# Patient Record
Sex: Female | Born: 2000 | Race: Black or African American | Hispanic: No | Marital: Single | Smoking: Never smoker
Health system: Southern US, Community
[De-identification: ages and names within clinical notes are randomized; demographics above are authoritative.]

## PROBLEM LIST (undated history)

## (undated) DIAGNOSIS — J45909 Unspecified asthma, uncomplicated: Secondary | ICD-10-CM

## (undated) DIAGNOSIS — A749 Chlamydial infection, unspecified: Secondary | ICD-10-CM

## (undated) HISTORY — DX: Chlamydial infection, unspecified: A74.9

---
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
## 2021-01-10 ENCOUNTER — Other Ambulatory Visit: Payer: Self-pay

## 2021-01-10 ENCOUNTER — Ambulatory Visit: Payer: Medicaid Other | Attending: Family Medicine

## 2021-01-10 DIAGNOSIS — M25562 Pain in left knee: Secondary | ICD-10-CM | POA: Diagnosis not present

## 2021-01-10 DIAGNOSIS — M6281 Muscle weakness (generalized): Secondary | ICD-10-CM | POA: Diagnosis not present

## 2021-01-10 DIAGNOSIS — M25561 Pain in right knee: Secondary | ICD-10-CM | POA: Diagnosis not present

## 2021-01-10 DIAGNOSIS — G8929 Other chronic pain: Secondary | ICD-10-CM | POA: Diagnosis not present

## 2021-01-10 DIAGNOSIS — R262 Difficulty in walking, not elsewhere classified: Secondary | ICD-10-CM | POA: Insufficient documentation

## 2021-01-11 NOTE — Therapy (Signed)
[facility] [address] [place], [place], [address] Phone: [phone]   Fax:  [phone]  Physical Therapy Evaluation  Patient Details  Name: Tammy Jackson MRN: [id number] Date of Birth: [birth year] Referring Provider (Tammy Jackson): McDiarmid, Leighton Roach, MD   Encounter Date: 01/10/2021   Tammy Jackson End of Session - 01/11/21 0807    Visit Number 1    Number of Visits 13    Date for Tammy Jackson Re-Evaluation 03/03/21    Authorization Type [place] MEDICAID HEALTHY BLUE    Progress Note Due on Visit 10    Tammy Jackson Start Time 1100    Tammy Jackson Stop Time 1140    Tammy Jackson Time Calculation (min) 40 min    Activity Tolerance Patient tolerated treatment well    Behavior During Therapy [facility] for tasks assessed/performed           Past Medical History:  Diagnosis Date  . Asthma     History reviewed. No pertinent surgical history.  There were no vitals filed for this visit.    Subjective Assessment - 01/10/21 1150    Subjective Both knees having been hurting for a couple of years, R>L. Takes naproxen sometimes before or after a walk. Tries to walk every other day, pain does not occur with each walk. Pain occurs during and after walking. Walks for 15 to 20 mins.    Patient Stated Goals For my knee to be fixed    Currently in Pain? Yes    Pain Score 5     Pain Location Knee    Pain Orientation Right    Pain Descriptors / Indicators Aching    Pain Type Chronic pain    Pain Radiating Towards NA    Pain Onset More than a month ago    Pain Frequency Intermittent    Aggravating Factors  Walking, squating    Pain Relieving Factors Naproxen sometimes, rest and elevating legs    Multiple Pain Sites Yes    Pain Score 0    Pain Location Knee    Pain Orientation Left              [facility] Tammy Jackson Assessment - 01/11/21 0001      Assessment   Medical Diagnosis Chronic pain of both knees    Referring Provider (Tammy Jackson) McDiarmid, Leighton Roach, MD    Onset Date/Surgical Date --   a couple  of years   Hand Dominance Right    Prior Therapy No      Precautions   Precautions None      Restrictions   Weight Bearing Restrictions No      Balance Screen   Has the patient fallen in the past 6 months No      Home Environment   Living Environment Private residence    Living Arrangements Parent    Type of Home Apartment    Home Access Stairs to enter    Entrance Stairs-Number of Steps 14    Entrance Stairs-Rails Right;Left    Home Layout One level      Prior Function   Level of Independence Independent    Vocation Unemployed    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      Cognition   Overall Cognitive Status Within Functional Limits for tasks assessed      Observation/Other Assessments   Focus on Therapeutic Outcomes (FOTO)  NA      Sensation   Light Touch Appears Intact  Posture/Postural Control   Posture Comments Valgus knee alignment      ROM / Strength   AROM / PROM / Strength AROM;Strength      AROM   Overall AROM Comments Hip, knee, ankle AROM is WNLs      Strength   Strength Assessment Site Hip;Knee    Right/Left Hip Right;Left    Right Hip Flexion 4/5    Right Hip Extension 4/5    Right Hip External Rotation  4+/5    Right Hip Internal Rotation 4+/5    Right Hip ABduction 4+/5    Right Hip ADduction 4+/5    Left Hip Flexion 4+/5    Left Hip Extension 4+/5    Left Hip External Rotation 4+/5    Left Hip Internal Rotation 4+/5    Left Hip ABduction 4+/5    Left Hip ADduction 4+/5    Right/Left Knee Right;Left    Right Knee Flexion 5/5   Tammy Jackson reports her muscle feels weaker, shaky   Right Knee Extension 5/5   Tammy Jackson reports her muscle feels weaker, shaky   Left Knee Flexion 5/5    Left Knee Extension 5/5      Palpation   Patella mobility WNLs    Palpation comment TTP to the R medial distal patella      Special Tests    Special Tests Knee Special Tests;Laxity/Instability Tests;Meniscus Tests    Laxity/Instability   Anterior drawer test;Posterior drawer test    Knee Special tests  Patellofemoral Apprehension Test;Patellofemoral Grind Test (Clarke's Sign)    Meniscus Tests McMurray Test      Anterior drawer test   Findings Negative    Side Right   Lt     Posterior drawer test   Findings Negative    Side  Right   Lt     Patellofemoral Apprehension Test    Findings Negative    Side  Right   Lt     Patellofemoral Grind test (Clark's Sign)   Findings Negative    Side  Right   Lt     McMurray Test   Findings Negative    Side Right   Lt     Transfers   Transfers Sit to Stand;Stand to Sit    Sit to Stand 7: Independent      Ambulation/Gait   Ambulation/Gait Yes    Ambulation/Gait Assistance 7: Independent    Gait Pattern Step-through pattern   lateral heel strike to medial toe off   Gait velocity WNLs                      Objective measurements completed on examination: See above findings.               Tammy Jackson Education - 01/11/21 0807    Education Details Eval findings, POC, HEP    Person(s) Educated Patient    Methods Explanation;Demonstration;Tactile cues;Verbal cues;Handout    Comprehension Verbalized understanding;Returned demonstration;Verbal cues required;Tactile cues required            Tammy Jackson Short Term Goals - 01/11/21 0949      Tammy Jackson SHORT TERM GOAL #1   Title Tammy Jackson will be Ind in an initial HEP    Baseline Started on eval    Status New    Target Date 02/01/21      Tammy Jackson SHORT TERM GOAL #2   Title Tammy Jackson will voice understanding of measures and techniques to assist in pain reduction and management  Status New    Target Date 02/01/21             Tammy Jackson Long Term Goals - 01/11/21 0950      Tammy Jackson LONG TERM GOAL #1   Title Tammy Jackson will be Ind in a final HEP to maintain or progress achieved LOF    Status New    Target Date 03/03/21      Tammy Jackson LONG TERM GOAL #2   Title Tammy Jackson will be able to walk 20 to 30 mins and asc/dsc steps with decreased intermittent knee pain of  2/10 or less    Baseline 5/10    Status New    Target Date 03/03/21      Tammy Jackson LONG TERM GOAL #3   Title Tammy Jackson will be able to complete 5 partial SL sit to stands from an appropriate height with the R demonstrating equal control/stability to the L    Status New    Target Date 03/03/21      Tammy Jackson LONG TERM GOAL #4   Title Tammy Jackson's R hip flexors and hip extensors will demostrate 4+ to 5/5 strength and Tammy Jackson will report the feeling the R quad and hamstring strength being equal to the LP    Baseline R hip flexors and extensors 4/5, Tammy Jackson reports her R quads and hamstrings feel weaker than the L although they test at 5/5    Status New    Target Date 03/03/21                  Plan - 01/11/21 0809    Clinical Impression Statement Tammy Jackson presents c a chronic Hx of intermittent knee pain R> L. Currently, Tammy Jackson is not experiencing L knee pain. 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 R hip flexion and extension are decreased in comparison to L. While testing at 5/5, Tammy Jackson reports the R quads and hamstrings feel weaker than the L. Tammy Jackson will benefit for Tammy Jackson 2w6 to address R LE strength and flexibility to decrease pain and optimize function.    Personal Factors and Comorbidities Time since onset of injury/illness/exacerbation;Comorbidity 1    Comorbidities High BMI    Examination-Activity Limitations Locomotion Level;Stairs;Squat;Bend    Stability/Clinical Decision Making Stable/Uncomplicated    Clinical Decision Making Low    Rehab Potential Good    Tammy Jackson Frequency 2x / week    Tammy Jackson Duration 6 weeks    Tammy Jackson Treatment/Interventions ADLs/Self Care Home Management;Cryotherapy;Electrical Stimulation;Ultrasound;Moist Heat;Iontophoresis 4mg /ml Dexamethasone;Gait training;Stair training;Functional mobility training;Therapeutic activities;Therapeutic exercise;Balance training;Manual techniques;Patient/family education;Passive range of motion;Dry needling;Taping;Joint Manipulations    Tammy Jackson Next Visit Plan Assess R hip flexors, quad, and  hamstring flexibility. Assess response to HEP. Progress ther ex as inducated. Use modalities and manual techniques as indicated.    Tammy Jackson Home Exercise Plan           Patient will benefit from skilled therapeutic intervention in order to improve the following deficits and impairments:  Difficulty walking,Obesity,Decreased activity tolerance,Pain,Decreased strength  Visit Diagnosis: Chronic pain of both knees  Difficulty in walking, not elsewhere classified  Muscle weakness (generalized)     Problem List Patient Active Problem List   Diagnosis Date Noted  . Nausea 09/29/2020  . Encounter for initial prescription of contraceptive pills 09/29/2020  . Viral illness 06/06/2020  . Bilateral knee pain 05/12/2020  . Encounter for medical examination to establish care 05/12/2020  . Abnormal uterine bleeding (AUB) 07/25/2019  . Symptomatic anemia 07/25/2019    09/24/2019 Tammy Jackson, Tammy Jackson 01/11/21 9:59 AM  [facility] [address] [place], [place], [address] Phone: [phone]   Fax:  [phone]  Name: Tammy Jackson MRN: [id number] Date of Birth: 11/05/2000   Check all possible CPT codes: 97110- Therapeutic Exercise, [phone] - Gait Training, [phone] - Manual Therapy, 97530 - Therapeutic Activities, [id number] - Self Care, [phone] - Electrical stimulation (Manual) and [id number] - Ultrasound

## 2021-01-16 ENCOUNTER — Other Ambulatory Visit: Payer: Self-pay

## 2021-01-16 ENCOUNTER — Encounter: Payer: Self-pay | Admitting: Physical Therapy

## 2021-01-16 ENCOUNTER — Ambulatory Visit: Payer: Medicaid Other | Attending: Family Medicine | Admitting: Physical Therapy

## 2021-01-16 DIAGNOSIS — R262 Difficulty in walking, not elsewhere classified: Secondary | ICD-10-CM | POA: Insufficient documentation

## 2021-01-16 DIAGNOSIS — M25562 Pain in left knee: Secondary | ICD-10-CM | POA: Diagnosis not present

## 2021-01-16 DIAGNOSIS — G8929 Other chronic pain: Secondary | ICD-10-CM | POA: Insufficient documentation

## 2021-01-16 DIAGNOSIS — M25561 Pain in right knee: Secondary | ICD-10-CM | POA: Diagnosis not present

## 2021-01-16 DIAGNOSIS — M6281 Muscle weakness (generalized): Secondary | ICD-10-CM | POA: Insufficient documentation

## 2021-01-16 NOTE — Therapy (Signed)
[facility] [address] [place], [place], [address] Phone: [phone]   Fax:  [phone]  Physical Therapy Treatment  Patient Details  Name: Tammy Jackson MRN: [id number] Date of Birth: 08/17/2001 Referring Provider (PT): McDiarmid, Leighton Roach, MD   Encounter Date: 01/16/2021   PT End of Session - 01/16/21 1231    Visit Number 2    Number of Visits 13    Date for PT Re-Evaluation 03/03/21    Authorization Type [place] MEDICAID HEALTHY BLUE    PT Start Time 1145    PT Stop Time 1228    PT Time Calculation (min) 43 min    Activity Tolerance Patient tolerated treatment well    Behavior During Therapy [facility] for tasks assessed/performed           Past Medical History:  Diagnosis Date  . Asthma     History reviewed. No pertinent surgical history.  There were no vitals filed for this visit.   Subjective Assessment - 01/16/21 1147    Subjective No new complaints/concerns since initial eval. No pain pre-tx.    Currently in Pain? No/denies                             [facility] Adult PT Treatment/Exercise - 01/16/21 0001      Exercises   Exercises Knee/Hip      Knee/Hip Exercises: Aerobic   Recumbent Bike L1 x 5 minutes      Knee/Hip Exercises: Standing   Hip Flexion AROM;Stengthening;Right;Left;10 reps;Knee straight    Hip Flexion Limitations (standing SLR with green band)    Terminal Knee Extension AROM;Right;Strengthening;Left;1 set;15 reps    Theraband Level (Terminal Knee Extension) Level 3 (Green)    Hip Abduction AROM;Stengthening;Right;Left;10 reps    Abduction Limitations green band    Hip Extension AROM;Stengthening;Right;Left;10 reps    Extension Limitations green band    Functional Squat Limitations partial squat at counter x 15 reps    Other Standing Knee Exercises sidestepping at counter with green band around ankles back and forth x 2 (approximately 10 feet)      Knee/Hip Exercises:  Seated   Long Arc Quad AROM;Strengthening;Right;Left;2 sets;10 reps    Long Arc Quad Weight 4 lbs.    Other Seated Knee/Hip Exercises seated hip ER with green Theraband 2x10 ea. bilat.      Knee/Hip Exercises: Supine   Bridges AROM;Strengthening;Both;2 sets;10 reps    Straight Leg Raises AROM;Strengthening;Right;Left;2 sets    Other Supine Knee/Hip Exercises clamshell green band 2x10      Manual Therapy   Manual Therapy Joint mobilization    Joint Mobilization patellar mobilization bilat. grade III medial glides and tilt                  PT Education - 01/16/21 1230    Education Details HEP, POC    Person(s) Educated Patient    Methods Explanation;Demonstration;Verbal cues;Handout    Comprehension Verbalized understanding;Returned demonstration            PT Short Term Goals - 01/11/21 0949      PT SHORT TERM GOAL #1   Title Pt will be Ind in an initial HEP    Baseline Started on eval    Status New    Target Date 02/01/21      PT SHORT TERM GOAL #2   Title Pt will voice understanding of measures and techniques to assist in pain reduction  and management    Status New    Target Date 02/01/21             PT Long Term Goals - 01/11/21 0950      PT LONG TERM GOAL #1   Title Pt will be Ind in a final HEP to maintain or progress achieved LOF    Status New    Target Date 03/03/21      PT LONG TERM GOAL #2   Title Pt will be able to walk 20 to 30 mins and asc/dsc steps with decreased intermittent knee pain of 2/10 or less    Baseline 5/10    Status New    Target Date 03/03/21      PT LONG TERM GOAL #3   Title Pt will be able to complete 5 partial SL sit to stands from an appropriate height with the R demonstrating equal control/stability to the L    Status New    Target Date 03/03/21      PT LONG TERM GOAL #4   Title Pt's R hip flexors and hip extensors will demostrate 4+ to 5/5 strength and pt will report the feeling the R quad and hamstring strength  being equal to the LP    Baseline R hip flexors and extensors 4/5, pt reports her R quads and hamstrings feel weaker than the L although they test at 5/5    Status New    Target Date 03/03/21                 Plan - 01/16/21 1231    Clinical Impression Statement Pt. returns for first follow up visit after initial evaluation-worked on open and closed chain knee and hip strengthening as noted per flowsheet with good tolerance/no significant pain reported and also added patellar mobilizations for medial glide and tilt also with good tolerance. Updated HEP with hip abduction SLR and bridge for further strengthening. Symptoms and presentation consistent with PFPS and expect gradual progress with continued strengthening with PT.    Personal Factors and Comorbidities Time since onset of injury/illness/exacerbation;Comorbidity 1    Comorbidities High BMI    Examination-Activity Limitations Locomotion Level;Stairs;Squat;Bend    Stability/Clinical Decision Making Stable/Uncomplicated    Clinical Decision Making Low    Rehab Potential Good    PT Frequency 2x / week    PT Duration 6 weeks    PT Treatment/Interventions ADLs/Self Care Home Management;Cryotherapy;Electrical Stimulation;Ultrasound;Moist Heat;Iontophoresis 4mg /ml Dexamethasone;Gait training;Stair training;Functional mobility training;Therapeutic activities;Therapeutic exercise;Balance training;Manual techniques;Patient/family education;Passive range of motion;Dry needling;Taping;Joint Manipulations    PT Next Visit Plan Continue/progress hip and knee strengthening as tolerated, pending progess and response also consider trial patellar taping if needed    PT Home Exercise Plan    Consulted and Agree with Plan of Care Patient           Patient will benefit from skilled therapeutic intervention in order to improve the following deficits and impairments:  Difficulty walking,Obesity,Decreased activity tolerance,Pain,Decreased  strength  Visit Diagnosis: Chronic pain of both knees  Difficulty in walking, not elsewhere classified  Muscle weakness (generalized)     Problem List Patient Active Problem List   Diagnosis Date Noted  . Nausea 09/29/2020  . Encounter for initial prescription of contraceptive pills 09/29/2020  . Viral illness 06/06/2020  . Bilateral knee pain 05/12/2020  . Encounter for medical examination to establish care 05/12/2020  . Abnormal uterine bleeding (AUB) 07/25/2019  . Symptomatic anemia 07/25/2019    09/24/2019, PT, DPT  01/16/21 12:35 PM  [facility] [address] [place], [place], [address] Phone: [phone]   Fax:  [phone]  Name: Tammy Jackson MRN: [id number] Date of Birth: 24-Oct-2000

## 2021-01-18 ENCOUNTER — Ambulatory Visit: Payer: Medicaid Other | Admitting: Physical Therapy

## 2021-01-23 ENCOUNTER — Other Ambulatory Visit: Payer: Self-pay

## 2021-01-23 ENCOUNTER — Ambulatory Visit: Payer: Medicaid Other | Admitting: Physical Therapy

## 2021-01-23 ENCOUNTER — Encounter: Payer: Self-pay | Admitting: Physical Therapy

## 2021-01-23 DIAGNOSIS — M25562 Pain in left knee: Secondary | ICD-10-CM

## 2021-01-23 DIAGNOSIS — M6281 Muscle weakness (generalized): Secondary | ICD-10-CM | POA: Diagnosis not present

## 2021-01-23 DIAGNOSIS — M25561 Pain in right knee: Secondary | ICD-10-CM | POA: Diagnosis not present

## 2021-01-23 DIAGNOSIS — R262 Difficulty in walking, not elsewhere classified: Secondary | ICD-10-CM | POA: Diagnosis not present

## 2021-01-23 DIAGNOSIS — G8929 Other chronic pain: Secondary | ICD-10-CM

## 2021-01-23 NOTE — Therapy (Signed)
[facility] [address] [place], [place], [address] Phone: [phone]   Fax:  [phone]  Physical Therapy Treatment  Patient Details  Name: Tammy Jackson MRN: [id number] Date of Birth: [birth year] Referring Provider (PT): McDiarmid, Leighton Roach, MD   Encounter Date: 01/23/2021   PT End of Session - 01/23/21 1159    Visit Number 3    Number of Visits 13    Date for PT Re-Evaluation 03/03/21    Authorization Type [place] MEDICAID HEALTHY BLUE    Progress Note Due on Visit 10    PT Start Time 1155   arrived late   PT Stop Time 1233    PT Time Calculation (min) 38 min    Activity Tolerance Patient tolerated treatment well    Behavior During Therapy [facility] for tasks assessed/performed           Past Medical History:  Diagnosis Date  . Asthma     History reviewed. No pertinent surgical history.  There were no vitals filed for this visit.   Subjective Assessment - 01/23/21 1157    Subjective Pt. reports still having pain associated with work activities but noting less pain than previously on days off. Just got off work so noting pain 4/10 this AM.    Currently in Pain? Yes    Pain Score 4     Pain Location Knee    Pain Orientation Right;Left    Pain Descriptors / Indicators Aching    Pain Type Chronic pain    Pain Onset More than a month ago    Pain Frequency Intermittent    Aggravating Factors  standing, lifing    Pain Relieving Factors rest, elevating legs, medication    Effect of Pain on Daily Activities impacts tolerance for work duties                             [facility] Adult PT Treatment/Exercise - 01/23/21 0001      Knee/Hip Exercises: Aerobic   Recumbent Bike L1 x 5 minutes      Knee/Hip Exercises: Standing   Terminal Knee Extension AROM;Strengthening;Left;Right;2 sets;10 reps;Theraband    Theraband Level (Terminal Knee Extension) Level 3 (Green)    Functional Squat Limitations partial  squat at counter x 20 reps    Other Standing Knee Exercises Monster walk green band proximal to knees 20 feet x 2    Other Standing Knee Exercises sidestepping with green Theraband around ankles at counter 10 feet back and forth x 2      Knee/Hip Exercises: Seated   Long Arc Quad AROM;Strengthening;Right;Left;2 sets;10 reps    Long Arc Quad Weight 5 lbs.    Other Seated Knee/Hip Exercises seated hip ER with green Theraband 2x10 ea. bilat.    Hamstring Curl AROM;Strengthening;Both;2 sets;10 reps    Hamstring Limitations green band      Knee/Hip Exercises: Supine   Bridges with Ball Squeeze AROM;Strengthening;Both;2 sets;10 reps    Straight Leg Raises AROM;Strengthening;Right;Left;2 sets;10 reps      Manual Therapy   Manual Therapy Taping    Joint Mobilization patellar mobilization bilat. grade III medial glides and tilt    McConnell taping for medial glide bilat. patella                  PT Education - 01/23/21 1250    Education Details taping-trial to see if any symptom relief-pt. instructed to leave on no more than  1-2 days    Person(s) Educated Patient    Methods Explanation;Demonstration;Verbal cues    Comprehension Verbalized understanding            PT Short Term Goals - 01/11/21 0949      PT SHORT TERM GOAL #1   Title Pt will be Ind in an initial HEP    Baseline Started on eval    Status New    Target Date 02/01/21      PT SHORT TERM GOAL #2   Title Pt will voice understanding of measures and techniques to assist in pain reduction and management    Status New    Target Date 02/01/21             PT Long Term Goals - 01/11/21 0950      PT LONG TERM GOAL #1   Title Pt will be Ind in a final HEP to maintain or progress achieved LOF    Status New    Target Date 03/03/21      PT LONG TERM GOAL #2   Title Pt will be able to walk 20 to 30 mins and asc/dsc steps with decreased intermittent knee pain of 2/10 or less    Baseline 5/10    Status New     Target Date 03/03/21      PT LONG TERM GOAL #3   Title Pt will be able to complete 5 partial SL sit to stands from an appropriate height with the R demonstrating equal control/stability to the L    Status New    Target Date 03/03/21      PT LONG TERM GOAL #4   Title Pt's R hip flexors and hip extensors will demostrate 4+ to 5/5 strength and pt will report the feeling the R quad and hamstring strength being equal to the LP    Baseline R hip flexors and extensors 4/5, pt reports her R quads and hamstrings feel weaker than the L although they test at 5/5    Status New    Target Date 03/03/21                 Plan - 01/23/21 1251    Clinical Impression Statement Though still pain with work duties mild improvement noted from previous status for improving knee pain. Continued exercise progression with good tolerance and performed trial patellar taping (end of session) for medial glide so will await further response and continue to include as found beneficial and (if beneficial) instruct on home technique as well. Plan continue PT for further progress to help relieve knee pain and improve tolerance for work duties.    Personal Factors and Comorbidities Time since onset of injury/illness/exacerbation;Comorbidity 1    Comorbidities High BMI    Examination-Activity Limitations Locomotion Level;Stairs;Squat;Bend    Stability/Clinical Decision Making Stable/Uncomplicated    Clinical Decision Making Low    Rehab Potential Good    PT Frequency 2x / week    PT Duration 6 weeks    PT Treatment/Interventions ADLs/Self Care Home Management;Cryotherapy;Electrical Stimulation;Ultrasound;Moist Heat;Iontophoresis 4mg /ml Dexamethasone;Gait training;Stair training;Functional mobility training;Therapeutic activities;Therapeutic exercise;Balance training;Manual techniques;Patient/family education;Passive range of motion;Dry needling;Taping;Joint Manipulations    PT Next Visit Plan check response to trial  patellar taping, continue to progress knee and hip strengthening as tolerated    PT Home Exercise Plan    Consulted and Agree with Plan of Care Patient           Patient will benefit from skilled therapeutic intervention in  order to improve the following deficits and impairments:  Difficulty walking,Obesity,Decreased activity tolerance,Pain,Decreased strength  Visit Diagnosis: Chronic pain of both knees  Difficulty in walking, not elsewhere classified  Muscle weakness (generalized)     Problem List Patient Active Problem List   Diagnosis Date Noted  . Nausea 09/29/2020  . Encounter for initial prescription of contraceptive pills 09/29/2020  . Viral illness 06/06/2020  . Bilateral knee pain 05/12/2020  . Encounter for medical examination to establish care 05/12/2020  . Abnormal uterine bleeding (AUB) 07/25/2019  . Symptomatic anemia 07/25/2019    Lazarus Gowda, PT, DPT 01/23/21 12:54 PM  [facility] [address] [place], [place], [address] Phone: [phone]   Fax:  [phone]  Name: Tammy Jackson MRN: [id number] Date of Birth: 2000-12-23

## 2021-01-25 ENCOUNTER — Other Ambulatory Visit: Payer: Self-pay

## 2021-01-25 ENCOUNTER — Encounter: Payer: Self-pay | Admitting: Physical Therapy

## 2021-01-25 ENCOUNTER — Ambulatory Visit: Payer: Medicaid Other | Admitting: Physical Therapy

## 2021-01-25 DIAGNOSIS — M6281 Muscle weakness (generalized): Secondary | ICD-10-CM

## 2021-01-25 DIAGNOSIS — R262 Difficulty in walking, not elsewhere classified: Secondary | ICD-10-CM | POA: Diagnosis not present

## 2021-01-25 DIAGNOSIS — M25562 Pain in left knee: Secondary | ICD-10-CM | POA: Diagnosis not present

## 2021-01-25 DIAGNOSIS — G8929 Other chronic pain: Secondary | ICD-10-CM

## 2021-01-25 DIAGNOSIS — M25561 Pain in right knee: Secondary | ICD-10-CM | POA: Diagnosis not present

## 2021-01-25 NOTE — Therapy (Signed)
[facility] [address] [place], [place], [address] Phone: [phone]   Fax:  [phone]  Physical Therapy Treatment  Patient Details  Name: Tammy Jackson MRN: [id number] Date of Birth: 2001-03-25 Referring Provider (PT): McDiarmid, Leighton Roach, MD   Encounter Date: 01/25/2021   PT End of Session - 01/25/21 1116    Visit Number 4    Number of Visits 13    Date for PT Re-Evaluation 03/03/21    Authorization Type [place] MEDICAID HEALTHY BLUE    PT Start Time 1107    PT Stop Time 1148    PT Time Calculation (min) 41 min    Activity Tolerance Patient tolerated treatment well    Behavior During Therapy [facility] for tasks assessed/performed           Past Medical History:  Diagnosis Date  . Asthma     History reviewed. No pertinent surgical history.  There were no vitals filed for this visit.   Subjective Assessment - 01/25/21 1114    Subjective Pt. reports had benefit from patellar taping applied at end of last session (medial glide). No new complaints/concerns otherwise this AM.    Currently in Pain? No/denies                             [facility] Adult PT Treatment/Exercise - 01/25/21 0001      Knee/Hip Exercises: Aerobic   Recumbent Bike L1 x 5 minutes      Knee/Hip Exercises: Standing   Heel Raises Both;2 sets;10 reps    Heel Raises Limitations on Airex holding 10 lb. DB ea. UE    Functional Squat Limitations TRX squat 2x10    Other Standing Knee Exercises SL RDL x 10 reps ea. side bilat., reverse partial lunge at counter with back foot sliding back on towel 2x10 ea. bilat.    Other Standing Knee Exercises Monster walk green band around ankles 25 feet x 2      Knee/Hip Exercises: Seated   Long Arc Quad AROM;Strengthening;Right;Left;2 sets;10 reps    Long Arc Quad Weight 5 lbs.    Other Seated Knee/Hip Exercises seated hip ER with green Theraband 2x10 ea. bilat.    Hamstring Curl  AROM;Strengthening;Both;2 sets;10 reps    Hamstring Limitations green band      Knee/Hip Exercises: Supine   Bridges with Ball Squeeze AROM;Strengthening;Both;2 sets;10 reps    Straight Leg Raises AROM;Strengthening;Right;Left;2 sets;10 reps    Other Supine Knee/Hip Exercises unilateral alternating clamshell green band x 20 reps ea. side bilaterally      Manual Therapy   McConnell taping for medial glide bilat. patella                    PT Short Term Goals - 01/11/21 0949      PT SHORT TERM GOAL #1   Title Pt will be Ind in an initial HEP    Baseline Started on eval    Status New    Target Date 02/01/21      PT SHORT TERM GOAL #2   Title Pt will voice understanding of measures and techniques to assist in pain reduction and management    Status New    Target Date 02/01/21             PT Long Term Goals - 01/11/21 0950      PT LONG TERM GOAL #1   Title Pt will be  Ind in a final HEP to maintain or progress achieved LOF    Status New    Target Date 03/03/21      PT LONG TERM GOAL #2   Title Pt will be able to walk 20 to 30 mins and asc/dsc steps with decreased intermittent knee pain of 2/10 or less    Baseline 5/10    Status New    Target Date 03/03/21      PT LONG TERM GOAL #3   Title Pt will be able to complete 5 partial SL sit to stands from an appropriate height with the R demonstrating equal control/stability to the L    Status New    Target Date 03/03/21      PT LONG TERM GOAL #4   Title Pt's R hip flexors and hip extensors will demostrate 4+ to 5/5 strength and pt will report the feeling the R quad and hamstring strength being equal to the LP    Baseline R hip flexors and extensors 4/5, pt reports her R quads and hamstrings feel weaker than the L although they test at 5/5    Status New    Target Date 03/03/21                 Plan - 01/25/21 1135    Clinical Impression Statement Good response to addition patellar taping so today applied  prior to exercises. Able to progress closed chain exercises with TRX squat and reverse lunge variation with good tolerance. So far responding well to therapy with some improvment of pain but plan continue PT for further progress to immprove tolerance for work duties.    Personal Factors and Comorbidities Time since onset of injury/illness/exacerbation;Comorbidity 1    Examination-Activity Limitations Locomotion Level;Stairs;Squat;Bend    Stability/Clinical Decision Making Stable/Uncomplicated    Clinical Decision Making Low    Rehab Potential Good    PT Frequency 2x / week    PT Duration 6 weeks    PT Treatment/Interventions ADLs/Self Care Home Management;Cryotherapy;Electrical Stimulation;Ultrasound;Moist Heat;Iontophoresis 4mg /ml Dexamethasone;Gait training;Stair training;Functional mobility training;Therapeutic activities;Therapeutic exercise;Balance training;Manual techniques;Patient/family education;Passive range of motion;Dry needling;Taping;Joint Manipulations    PT Next Visit Plan continue patellar taping and over the next 1-2 visits instruct for home taping technique, continue to progress knee and hip strengthening as tolerated with closed and open chain strengthening    PT Home Exercise Plan    Consulted and Agree with Plan of Care Patient           Patient will benefit from skilled therapeutic intervention in order to improve the following deficits and impairments:  Difficulty walking,Obesity,Decreased activity tolerance,Pain,Decreased strength  Visit Diagnosis: Chronic pain of both knees  Difficulty in walking, not elsewhere classified  Muscle weakness (generalized)     Problem List Patient Active Problem List   Diagnosis Date Noted  . Nausea 09/29/2020  . Encounter for initial prescription of contraceptive pills 09/29/2020  . Viral illness 06/06/2020  . Bilateral knee pain 05/12/2020  . Encounter for medical examination to establish care 05/12/2020  .  Abnormal uterine bleeding (AUB) 07/25/2019  . Symptomatic anemia 07/25/2019    09/24/2019, PT, DPT 01/25/21 11:50 AM  [facility] [address] [place], [place], [place] Phone: [phone]   Fax:  [phone]  Name: Tammy Jackson MRN: Sela Hilding Date of Birth: Feb 22, 2001

## 2021-01-26 ENCOUNTER — Encounter: Payer: Self-pay | Admitting: Family Medicine

## 2021-01-30 ENCOUNTER — Ambulatory Visit: Payer: Medicaid Other | Admitting: Physical Therapy

## 2021-02-01 ENCOUNTER — Other Ambulatory Visit: Payer: Self-pay

## 2021-02-01 ENCOUNTER — Ambulatory Visit: Payer: Medicaid Other

## 2021-02-01 DIAGNOSIS — G8929 Other chronic pain: Secondary | ICD-10-CM | POA: Diagnosis not present

## 2021-02-01 DIAGNOSIS — R262 Difficulty in walking, not elsewhere classified: Secondary | ICD-10-CM | POA: Diagnosis not present

## 2021-02-01 DIAGNOSIS — M25562 Pain in left knee: Secondary | ICD-10-CM | POA: Diagnosis not present

## 2021-02-01 DIAGNOSIS — M6281 Muscle weakness (generalized): Secondary | ICD-10-CM

## 2021-02-01 DIAGNOSIS — M25561 Pain in right knee: Secondary | ICD-10-CM | POA: Diagnosis not present

## 2021-02-01 NOTE — Therapy (Signed)
[facility] Outpatient Rehabilitation [facility] [address] [place], [place], [address] Phone: [phone]   Fax:  [phone]  Physical Therapy Treatment  Patient Details  Name: Tammy Jackson MRN: [id number] Date of Birth: 2001-04-09 Referring Provider (PT): McDiarmid, Leighton Roach, MD   Encounter Date: 02/01/2021   PT End of Session - 02/01/21 1119    Visit Number 5    Number of Visits 13    Date for PT Re-Evaluation 03/03/21    Authorization Type [place] MEDICAID HEALTHY BLUE    Authorization Time Period 4/5-5/14/22    Authorization - Visit Number 4    Authorization - Number of Visits 12    PT Start Time 1054   Pt was 7 mins late   PT Stop Time 1135    PT Time Calculation (min) 41 min    Activity Tolerance Patient tolerated treatment well    Behavior During Therapy [facility] for tasks assessed/performed           Past Medical History:  Diagnosis Date  . Asthma     History reviewed. No pertinent surgical history.  There were no vitals filed for this visit.   Subjective Assessment - 02/01/21 1058    Subjective Pt reports she just finished work and her knees are bothering her.    Patient Stated Goals For my knee to be fixed    Currently in Pain? Yes    Pain Score 7     Pain Location Knee    Pain Orientation Right;Left    Pain Descriptors / Indicators Aching    Pain Type Chronic pain    Pain Onset More than a month ago    Pain Frequency Intermittent    Aggravating Factors  standing and lifting    Pain Relieving Factors rest, elevating legs, medication    Effect of Pain on Daily Activities impacts tolerance for work duties                             [facility] Adult PT Treatment/Exercise - 02/01/21 0001      Exercises   Exercises Knee/Hip      Knee/Hip Exercises: Aerobic   Nustep 5 mins; L5; UEs/LEs      Knee/Hip Exercises: Seated   Long Arc Quad Right;Left;2 sets;10 reps    Long Arc Quad Weight 5 lbs.      Knee/Hip Exercises:  Supine   Bridges with Newman Pies Squeeze Strengthening;Both;2 sets;10 reps    Straight Leg Raises Right;Left;2 sets;10 reps    Other Supine Knee/Hip Exercises unilateral alternating clamshell green band 2x10 reps each side      Knee/Hip Exercises: Sidelying   Hip ABduction Right;Left;2 sets;10 reps      Manual Therapy   Manual Therapy Taping    McConnell Kinesiotaping for medial patella glide                  PT Education - 02/01/21 1251    Education Details HEP updated    Person(s) Educated Patient    Methods Explanation;Demonstration;Tactile cues;Verbal cues;Handout    Comprehension Verbalized understanding;Returned demonstration;Verbal cues required;Tactile cues required            PT Short Term Goals - 01/11/21 0949      PT SHORT TERM GOAL #1   Title Pt will be Ind in an initial HEP    Baseline Started on eval    Status New    Target Date 02/01/21  PT SHORT TERM GOAL #2   Title Pt will voice understanding of measures and techniques to assist in pain reduction and management    Status New    Target Date 02/01/21             PT Long Term Goals - 01/11/21 0950      PT LONG TERM GOAL #1   Title Pt will be Ind in a final HEP to maintain or progress achieved LOF    Status New    Target Date 03/03/21      PT LONG TERM GOAL #2   Title Pt will be able to walk 20 to 30 mins and asc/dsc steps with decreased intermittent knee pain of 2/10 or less    Baseline 5/10    Status New    Target Date 03/03/21      PT LONG TERM GOAL #3   Title Pt will be able to complete 5 partial SL sit to stands from an appropriate height with the R demonstrating equal control/stability to the L    Status New    Target Date 03/03/21      PT LONG TERM GOAL #4   Title Pt's R hip flexors and hip extensors will demostrate 4+ to 5/5 strength and pt will report the feeling the R quad and hamstring strength being equal to the LP    Baseline R hip flexors and extensors 4/5, pt reports her  R quads and hamstrings feel weaker than the L although they test at 5/5    Status New    Target Date 03/03/21                 Plan - 02/01/21 1253    Clinical Impression Statement Provided kinesiotaping with medial glides to B patellas. Provided application instructions to pt as completing. PT was continued for hip and quad strengthening today with open chain exercises. Resisted hip fallouts were added to HEP. After today's session pt reported a reduction of her bilat. knee pain from 7/10 to 4/10.    Personal Factors and Comorbidities Time since onset of injury/illness/exacerbation;Comorbidity 1    Comorbidities High BMI    Examination-Activity Limitations Locomotion Level;Stairs;Squat;Bend    Stability/Clinical Decision Making Stable/Uncomplicated    Clinical Decision Making Low    Rehab Potential Good    PT Frequency 2x / week    PT Duration 6 weeks    PT Treatment/Interventions ADLs/Self Care Home Management;Cryotherapy;Electrical Stimulation;Ultrasound;Moist Heat;Iontophoresis 4mg /ml Dexamethasone;Gait training;Stair training;Functional mobility training;Therapeutic activities;Therapeutic exercise;Balance training;Manual techniques;Patient/family education;Passive range of motion;Dry needling;Taping;Joint Manipulations    PT Next Visit Plan continue patellar taping and over the next 1-2 visits instruct for home taping technique, continue to progress knee and hip strengthening as tolerated with closed and open chain strengthening    PT Home Exercise Plan    Consulted and Agree with Plan of Care Patient           Patient will benefit from skilled therapeutic intervention in order to improve the following deficits and impairments:  Difficulty walking,Obesity,Decreased activity tolerance,Pain,Decreased strength  Visit Diagnosis: Chronic pain of both knees  Difficulty in walking, not elsewhere classified  Muscle weakness (generalized)     Problem List Patient  Active Problem List   Diagnosis Date Noted  . Nausea 09/29/2020  . Encounter for initial prescription of contraceptive pills 09/29/2020  . Viral illness 06/06/2020  . Bilateral knee pain 05/12/2020  . Encounter for medical examination to establish care 05/12/2020  . Abnormal uterine bleeding (  AUB) 07/25/2019  . Symptomatic anemia 07/25/2019    Joellyn Rued MS, PT 02/01/21 1:07 PM  [facility] [address] [place], [place], [address] Phone: [phone]   Fax:  [phone]  Name: Tammy Jackson MRN: [id number] Date of Birth: 03-23-2001

## 2021-02-04 NOTE — Progress Notes (Signed)
   Subjective:   Patient ID: Tammy Jackson    DOB: Jul 02, 2001, 20 y.o. female   MRN: [id number]  Tammy Jackson is a 20 y.o. female with a history of AUB and symptomatic anemia, bilateral knee pain here for Skin spots  HPI:  Patient presents with skin changes when she is exposed to the sunlight.  She describes it as a lacelike pattern on her legs.  She notes that it only occurs when she is exposed to the sun and it self resolves.  She also notes that it has happened when exposed to her laptop or other warm surfaces.  Denies any pain, itch, lightheadedness, dizziness.  Denies any personal or family history of autoimmune diseases.  Denies any significant past medical history or family medical history.  Denies any lower extremity swelling. Denies family history of vasculopathies.  Review of Systems:  Per HPI.   Objective:   BP 110/62   Pulse 98   Ht 5\' 4"  (1.626 m)   Wt 209 lb 6.4 oz (95 kg)   LMP 02/02/2021   SpO2 98%   BMI 35.94 kg/m  Vitals and nursing note reviewed.  General: pleasant young female, sitting comfortably in exam chair, well nourished, well developed, in no acute distress with non-toxic appearance Resp: breathing comfortably on room air, speaking in full sentences Skin: warm, dry, no current rash present MSK: Rgait normal Neuro: Alert and oriented, speech normal      Assessment & Plan:   Erythema ab igne Acute. Asymptomatic reticular rash when exposed to heat. History and patient photo appears most consistent with erythema ab igne. No current rash to evaluate. No pertinent personal or family medical history. Provided reassurance and discussed management options. Follow up PRN.  No orders of the defined types were placed in this encounter.  No orders of the defined types were placed in this encounter.    02/04/2021, DO PGY-3, [facility] 02/07/2021 3:18 PM

## 2021-02-06 ENCOUNTER — Other Ambulatory Visit: Payer: Self-pay

## 2021-02-06 ENCOUNTER — Ambulatory Visit: Payer: Medicaid Other

## 2021-02-06 DIAGNOSIS — M6281 Muscle weakness (generalized): Secondary | ICD-10-CM

## 2021-02-06 DIAGNOSIS — M25562 Pain in left knee: Secondary | ICD-10-CM | POA: Diagnosis not present

## 2021-02-06 DIAGNOSIS — G8929 Other chronic pain: Secondary | ICD-10-CM | POA: Diagnosis not present

## 2021-02-06 DIAGNOSIS — R262 Difficulty in walking, not elsewhere classified: Secondary | ICD-10-CM | POA: Diagnosis not present

## 2021-02-06 DIAGNOSIS — M25561 Pain in right knee: Secondary | ICD-10-CM | POA: Diagnosis not present

## 2021-02-07 ENCOUNTER — Encounter: Payer: Self-pay | Admitting: Family Medicine

## 2021-02-07 ENCOUNTER — Other Ambulatory Visit: Payer: Self-pay

## 2021-02-07 ENCOUNTER — Ambulatory Visit (INDEPENDENT_AMBULATORY_CARE_PROVIDER_SITE_OTHER): Payer: Medicaid Other | Admitting: Family Medicine

## 2021-02-07 VITALS — BP 110/62 | HR 98 | Ht 64.0 in | Wt 209.4 lb

## 2021-02-07 DIAGNOSIS — L590 Erythema ab igne [dermatitis ab igne]: Secondary | ICD-10-CM

## 2021-02-07 HISTORY — DX: Erythema ab igne (dermatitis ab igne): L59.0

## 2021-02-07 NOTE — Therapy (Signed)
[facility] Outpatient Rehabilitation [facility] [address] [place], [place], [address] Phone: [phone]   Fax:  [phone]  Physical Therapy Treatment  Patient Details  Name: Tammy Jackson MRN: [id number] Date of Birth: [birth year] Referring Provider (PT): McDiarmid, Leighton Roach, MD   Encounter Date: 02/06/2021   PT End of Session - 02/06/21 1556    Visit Number 6    Number of Visits 13    Date for PT Re-Evaluation 03/03/21    Authorization Type [place] MEDICAID HEALTHY BLUE    Authorization Time Period 4/5-5/14/22    Authorization - Visit Number 5    Authorization - Number of Visits 12    Progress Note Due on Visit 10    PT Start Time 1535    PT Stop Time 1615    PT Time Calculation (min) 40 min    Activity Tolerance Patient tolerated treatment well    Behavior During Therapy [facility] for tasks assessed/performed           Past Medical History:  Diagnosis Date  . Asthma     History reviewed. No pertinent surgical history.  There were no vitals filed for this visit.   Subjective Assessment - 02/06/21 1547    Subjective Pt reports her knees have been feeling better with moments of only low level pain. Today she reports having no knee pain.    Patient Stated Goals For my knee to be fixed    Currently in Pain? No/denies    Pain Score 3     Pain Location Knee    Pain Orientation Right;Left    Pain Descriptors / Indicators Aching    Pain Type Chronic pain    Pain Onset More than a month ago    Pain Frequency Intermittent                             [facility] Adult PT Treatment/Exercise - 02/07/21 0001      Exercises   Exercises Knee/Hip      Knee/Hip Exercises: Aerobic   Nustep 5 mins; L5; UEs/LEs      Knee/Hip Exercises: Seated   Long Arc Quad Right;Left;2 sets;10 reps    Long Arc Quad Weight 5 lbs.      Knee/Hip Exercises: Supine   Bridges with Newman Pies Squeeze Strengthening;Both;2 sets;10 reps    Straight Leg Raises  Right;Left;2 sets;10 reps    Straight Leg Raises Limitations 3 lbs    Other Supine Knee/Hip Exercises hip abd c green Tband 10x2; Hip add c ball sqeezes 15x, 3 sce      Knee/Hip Exercises: Sidelying   Hip ABduction Right;Left;2 sets;10 reps    Hip ABduction Limitations 3lbs                    PT Short Term Goals - 01/11/21 0949      PT SHORT TERM GOAL #1   Title Pt will be Ind in an initial HEP    Baseline Started on eval    Status New    Target Date 02/01/21      PT SHORT TERM GOAL #2   Title Pt will voice understanding of measures and techniques to assist in pain reduction and management    Status New    Target Date 02/01/21             PT Long Term Goals - 01/11/21 0950      PT LONG TERM GOAL #  1   Title Pt will be Ind in a final HEP to maintain or progress achieved LOF    Status New    Target Date 03/03/21      PT LONG TERM GOAL #2   Title Pt will be able to walk 20 to 30 mins and asc/dsc steps with decreased intermittent knee pain of 2/10 or less    Baseline 5/10    Status New    Target Date 03/03/21      PT LONG TERM GOAL #3   Title Pt will be able to complete 5 partial SL sit to stands from an appropriate height with the R demonstrating equal control/stability to the L    Status New    Target Date 03/03/21      PT LONG TERM GOAL #4   Title Pt's R hip flexors and hip extensors will demostrate 4+ to 5/5 strength and pt will report the feeling the R quad and hamstring strength being equal to the LP    Baseline R hip flexors and extensors 4/5, pt reports her R quads and hamstrings feel weaker than the L although they test at 5/5    Status New    Target Date 03/03/21                 Plan - 02/06/21 1557    Clinical Impression Statement PT was continued for bilateral knee and hip strengthening. Pt tolerated progression of reps and resistance. Pt's subjective report indicates progressive improvement with her bilateral knee pain.    Personal  Factors and Comorbidities Time since onset of injury/illness/exacerbation;Comorbidity 1    Comorbidities High BMI    Examination-Activity Limitations Locomotion Level;Stairs;Squat;Bend    Stability/Clinical Decision Making Stable/Uncomplicated    Clinical Decision Making Low    Rehab Potential Good    PT Frequency 2x / week    PT Duration 6 weeks    PT Treatment/Interventions ADLs/Self Care Home Management;Cryotherapy;Electrical Stimulation;Ultrasound;Moist Heat;Iontophoresis 4mg /ml Dexamethasone;Gait training;Stair training;Functional mobility training;Therapeutic activities;Therapeutic exercise;Balance training;Manual techniques;Patient/family education;Passive range of motion;Dry needling;Taping;Joint Manipulations    PT Next Visit Plan continue to progress knee and hip strengthening as tolerated with closed and open chain strengthening    PT Home Exercise Plan    Consulted and Agree with Plan of Care Patient           Patient will benefit from skilled therapeutic intervention in order to improve the following deficits and impairments:  Difficulty walking,Obesity,Decreased activity tolerance,Pain,Decreased strength  Visit Diagnosis: Chronic pain of both knees  Difficulty in walking, not elsewhere classified  Muscle weakness (generalized)     Problem List Patient Active Problem List   Diagnosis Date Noted  . Bilateral knee pain 05/12/2020  . Abnormal uterine bleeding (AUB) 07/25/2019  . Symptomatic anemia 07/25/2019    09/24/2019 MS, PT 02/07/21 6:58 AM  [facility] [address] [place], [place], [place] Phone: [phone]   Fax:  [phone]  Name: Tammy Jackson MRN: Sela Hilding Date of Birth: 01/07/2001

## 2021-02-07 NOTE — Patient Instructions (Signed)
   Erythema ab igne is a reticular, erythematous, pigmented dermatosis resulting from repeated exposures to moderate heat or infrared radiation  Can bee seen in relation to occupational exposure to heat sources (foundry workers, bakers), use of hot water bottles, heating pads or blankets (picture 25A-C), heated car seats, and among laptop users who hold computers on their thighs  Best way to treat this is limited exposure to heat.   Follow up if worsening symptoms.

## 2021-02-07 NOTE — Assessment & Plan Note (Signed)
Acute. Asymptomatic reticular rash when exposed to heat. History and patient photo appears most consistent with erythema ab igne. No current rash to evaluate. No pertinent personal or family medical history. Provided reassurance and discussed management options. Follow up PRN.

## 2021-02-08 ENCOUNTER — Ambulatory Visit: Payer: Medicaid Other

## 2021-02-08 DIAGNOSIS — M6281 Muscle weakness (generalized): Secondary | ICD-10-CM | POA: Diagnosis not present

## 2021-02-08 DIAGNOSIS — R262 Difficulty in walking, not elsewhere classified: Secondary | ICD-10-CM | POA: Diagnosis not present

## 2021-02-08 DIAGNOSIS — M25561 Pain in right knee: Secondary | ICD-10-CM | POA: Diagnosis not present

## 2021-02-08 DIAGNOSIS — M25562 Pain in left knee: Secondary | ICD-10-CM | POA: Diagnosis not present

## 2021-02-08 DIAGNOSIS — G8929 Other chronic pain: Secondary | ICD-10-CM | POA: Diagnosis not present

## 2021-02-08 NOTE — Therapy (Signed)
[place], [place], [address] Phone: [phone]   Fax:  [phone]  Physical Therapy Treatment  Patient Details  Name: Tammy Jackson MRN: [id number] Date of Birth: [birth year] Referring Provider (PT): McDiarmid, Blane Ohara, MD   Encounter Date: 02/08/2021   PT End of Session - 02/08/21 1106    Visit Number 7    Number of Visits 13    Date for PT Re-Evaluation 03/03/21    Authorization Type [place] MEDICAID HEALTHY BLUE    Authorization Time Period 4/5-5/14/22    Authorization - Visit Number 6    Authorization - Number of Visits 12    Progress Note Due on Visit 10    PT Start Time 1101    PT Stop Time 1131   13 min late   PT Time Calculation (min) 30 min    Activity Tolerance Patient tolerated treatment well    Behavior During Therapy [facility] for tasks assessed/performed           Past Medical History:  Diagnosis Date  . Asthma     History reviewed. No pertinent surgical history.  There were no vitals filed for this visit.   Subjective Assessment - 02/08/21 1110    Subjective Pt reports she is doing well with her knee pain. Denies knee pain today.    Patient Stated Goals For my knee to be fixed    Currently in Pain? No/denies    Pain Score 0-No pain    Pain Location Knee    Pain Orientation Right;Left                             [facility] Adult PT Treatment/Exercise - 02/08/21 0001      Exercises   Exercises Knee/Hip      Knee/Hip Exercises: Stretches   Gastroc Stretch Left;2 reps;30 seconds    Gastroc Stretch Limitations standing      Knee/Hip Exercises: Standing   Heel Raises Both;15 reps    Heel Raises Limitations toe lifts    Knee Flexion Right;Left;15 reps    Knee Flexion Limitations 3 lbs    Hip Flexion Right;Left;15 reps    Hip Flexion Limitations 3 lbs    Hip Abduction Right;Left;15 reps    Abduction Limitations 3 lbs    Hip Extension Right;Left     Extension Limitations 3 lbs    Wall Squat 10 reps;2 sets      Knee/Hip Exercises: Seated   Long Arc Quad Right;Left;1 set;15 reps    Long Arc Quad Weight 3 lbs.                  PT Education - 02/08/21 1119    Education Details Standing calf stretch    Person(s) Educated Patient    Methods Explanation;Demonstration;Tactile cues;Verbal cues;Handout    Comprehension Verbalized understanding;Returned demonstration;Verbal cues required;Tactile cues required            PT Short Term Goals - 02/08/21 1334      PT SHORT TERM GOAL #1   Title Pt will be Ind in an initial HEP    Baseline Started on eval    Status Achieved    Target Date 02/08/21      PT SHORT TERM GOAL #2   Title Pt will voice understanding of measures and techniques to assist in pain reduction and management    Status Achieved    Target Date 02/08/21  PT Long Term Goals - 01/11/21 0950      PT LONG TERM GOAL #1   Title Pt will be Ind in a final HEP to maintain or progress achieved LOF    Status New    Target Date 03/03/21      PT LONG TERM GOAL #2   Title Pt will be able to walk 20 to 30 mins and asc/dsc steps with decreased intermittent knee pain of 2/10 or less    Baseline 5/10    Status New    Target Date 03/03/21      PT LONG TERM GOAL #3   Title Pt will be able to complete 5 partial SL sit to stands from an appropriate height with the R demonstrating equal control/stability to the L    Status New    Target Date 03/03/21      PT LONG TERM GOAL #4   Title Pt's R hip flexors and hip extensors will demostrate 4+ to 5/5 strength and pt will report the feeling the R quad and hamstring strength being equal to the LP    Baseline R hip flexors and extensors 4/5, pt reports her R quads and hamstrings feel weaker than the L although they test at 5/5    Status New    Target Date 03/03/21                 Plan - 02/08/21 1126    Clinical Impression Statement Addresed L calf  cramp c gastroc stretch. Progressed LE strengthening c most of the exs completed in the CK[facility]. Pt tolerated the progression of exs in the CK[facility] with no adverse effects. STGs have been met. Pt will continue to benefit from PT to address knee ROM and strength to reduce pain and improve function of the knee/LEs.    Personal Factors and Comorbidities Time since onset of injury/illness/exacerbation;Comorbidity 1    Comorbidities High BMI    Examination-Activity Limitations Locomotion Level;Stairs;Squat;Bend    Stability/Clinical Decision Making Stable/Uncomplicated    Clinical Decision Making Low    Rehab Potential Good    PT Frequency 2x / week    PT Duration 6 weeks    PT Treatment/Interventions ADLs/Self Care Home Management;Cryotherapy;Electrical Stimulation;Ultrasound;Moist Heat;Iontophoresis 55m/ml Dexamethasone;Gait training;Stair training;Functional mobility training;Therapeutic activities;Therapeutic exercise;Balance training;Manual techniques;Patient/family education;Passive range of motion;Dry needling;Taping;Joint Manipulations    PT Next Visit Plan Continue to progress knee and hip strengthening as tolerated with closed and open chain strengthening, Assess LTGs    PT Home Exercise Plan [email or web address]   Consulted and Agree with Plan of Care Patient           Patient will benefit from skilled therapeutic intervention in order to improve the following deficits and impairments:  Difficulty walking,Obesity,Decreased activity tolerance,Pain,Decreased strength  Visit Diagnosis: Chronic pain of both knees  Difficulty in walking, not elsewhere classified  Muscle weakness (generalized)     Problem List Patient Active Problem List   Diagnosis Date Noted  . 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 Bilateral knee pain 05/12/2020  . Abnormal uterine bleeding (AUB) 07/25/2019  . Symptomatic anemia 07/25/2019    AGar PontoMS, PT 02/08/21 1:43 PM   [facility]195 [address]GD[address] [place] [address]Phone: [phone]  Fax:  [phone] Name: Tammy TARTEMRN: [id number] of Birth: 107/04/2001

## 2021-02-13 ENCOUNTER — Other Ambulatory Visit: Payer: Self-pay

## 2021-02-13 ENCOUNTER — Ambulatory Visit: Payer: Medicaid Other | Attending: Family Medicine

## 2021-02-13 DIAGNOSIS — R262 Difficulty in walking, not elsewhere classified: Secondary | ICD-10-CM | POA: Insufficient documentation

## 2021-02-13 DIAGNOSIS — G8929 Other chronic pain: Secondary | ICD-10-CM | POA: Diagnosis not present

## 2021-02-13 DIAGNOSIS — M25562 Pain in left knee: Secondary | ICD-10-CM | POA: Insufficient documentation

## 2021-02-13 DIAGNOSIS — M6281 Muscle weakness (generalized): Secondary | ICD-10-CM | POA: Diagnosis not present

## 2021-02-13 DIAGNOSIS — M25561 Pain in right knee: Secondary | ICD-10-CM | POA: Insufficient documentation

## 2021-02-13 NOTE — Therapy (Signed)
[facility] Outpatient Rehabilitation [facility] [address] [place], [place], [address] Phone: [phone]   Fax:  [phone]  Physical Therapy Treatment  Patient Details  Name: Tammy Jackson MRN: [id number] Date of Birth: 04-15-2001 Referring Provider (PT): McDiarmid, Leighton Roach, MD   Encounter Date: 02/13/2021   PT End of Session - 02/13/21 1222    Visit Number 8    Number of Visits 13    Date for PT Re-Evaluation 03/03/21    Authorization Type [place] MEDICAID HEALTHY BLUE    Authorization Time Period 4/5-5/14/22    Authorization - Visit Number 7    Authorization - Number of Visits 12    PT Start Time 1222    PT Stop Time 1302    PT Time Calculation (min) 40 min    Activity Tolerance Patient tolerated treatment well    Behavior During Therapy [facility] for tasks assessed/performed           Past Medical History:  Diagnosis Date  . Asthma     History reviewed. No pertinent surgical history.  There were no vitals filed for this visit.   Subjective Assessment - 02/13/21 1230    Subjective Pt dneies knee pai currently. When it occurs it is a 2/10 or less. Pt is pleased with her progress.    Patient Stated Goals For my knee to be fixed    Currently in Pain? No/denies    Pain Score 0-No pain    Pain Location Knee    Pain Orientation Right;Left    Pain Descriptors / Indicators Aching    Pain Type Chronic pain    Pain Onset More than a month ago    Pain Frequency Occasional                             [facility] Adult PT Treatment/Exercise - 02/13/21 0001      Knee/Hip Exercises: Stretches   Active Hamstring Stretch Right;Left;1 rep;30 seconds    Active Hamstring Stretch Limitations seated    Gastroc Stretch 1 rep;30 seconds    Gastroc Stretch Limitations standing      Knee/Hip Exercises: Aerobic   Nustep 5 mins; L5; LEs      Knee/Hip Exercises: Standing   Heel Raises Both;20 reps    Heel Raises Limitations toe lifts    Knee  Flexion Right;Left;15 reps    Knee Flexion Limitations 4 lbs    Hip Flexion Right;Left;15 reps    Hip Flexion Limitations 4 lbs    Hip Abduction Right;Left;15 reps    Abduction Limitations 4 lbs    Hip Extension Right;Left    Extension Limitations 4 lbs    SLS RDL reaches to counter height, 10x, L and R      Knee/Hip Exercises: Seated   Long Arc Quad Right;Left;2 sets;10 reps    Long Arc Quad Weight 4 lbs.    Sit to Sand 2 sets;10 reps   mat table 20inch height                   PT Short Term Goals - 02/08/21 1334      PT SHORT TERM GOAL #1   Title Pt will be Ind in an initial HEP    Baseline Started on eval    Status Achieved    Target Date 02/08/21      PT SHORT TERM GOAL #2   Title Pt will voice understanding of measures and techniques  to assist in pain reduction and management    Status Achieved    Target Date 02/08/21             PT Long Term Goals - 01/11/21 0950      PT LONG TERM GOAL #1   Title Pt will be Ind in a final HEP to maintain or progress achieved LOF    Status New    Target Date 03/03/21      PT LONG TERM GOAL #2   Title Pt will be able to walk 20 to 30 mins and asc/dsc steps with decreased intermittent knee pain of 2/10 or less    Baseline 5/10    Status New    Target Date 03/03/21      PT LONG TERM GOAL #3   Title Pt will be able to complete 5 partial SL sit to stands from an appropriate height with the R demonstrating equal control/stability to the L    Status New    Target Date 03/03/21      PT LONG TERM GOAL #4   Title Pt's R hip flexors and hip extensors will demostrate 4+ to 5/5 strength and pt will report the feeling the R quad and hamstring strength being equal to the LP    Baseline R hip flexors and extensors 4/5, pt reports her R quads and hamstrings feel weaker than the L although they test at 5/5    Status New    Target Date 03/03/21                 Plan - 02/13/21 1223    Clinical Impression Statement PT  was provided to progress knee/LE strain re: ther ex c load level and difficulty. Exercises were completd primarily in the closed kinetic chain. PT was tolerated without adverse effects. Pt's report indicates improving knee pain with ADLs and work involving prolonged standing.    Personal Factors and Comorbidities Time since onset of injury/illness/exacerbation;Comorbidity 1    Comorbidities High BMI    Examination-Activity Limitations Locomotion Level;Stairs;Squat;Bend    Clinical Decision Making Low    Rehab Potential Good    PT Frequency 2x / week    PT Duration 6 weeks    PT Treatment/Interventions ADLs/Self Care Home Management;Cryotherapy;Electrical Stimulation;Ultrasound;Moist Heat;Iontophoresis 4mg /ml Dexamethasone;Gait training;Stair training;Functional mobility training;Therapeutic activities;Therapeutic exercise;Balance training;Manual techniques;Patient/family education;Passive range of motion;Dry needling;Taping;Joint Manipulations    PT Next Visit Plan Continue to progress knee and hip strengthening as tolerated with closed and open chain strengthening, Assess LTGs    PT Home Exercise Plan    Consulted and Agree with Plan of Care Patient           Patient will benefit from skilled therapeutic intervention in order to improve the following deficits and impairments:  Difficulty walking,Obesity,Decreased activity tolerance,Pain,Decreased strength  Visit Diagnosis: Chronic pain of both knees  Difficulty in walking, not elsewhere classified  Muscle weakness (generalized)     Problem List Patient Active Problem List   Diagnosis Date Noted  . 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 Bilateral knee pain 05/12/2020  . Abnormal uterine bleeding (AUB) 07/25/2019  . Symptomatic anemia 07/25/2019    09/24/2019 MS, PT 02/13/21 1:41 PM  [facility] Out[facility] Rehabilitation [facility] [address] [place], [place], [place] Phone: [phone]   Fax:   [phone]  Name: Tammy Jackson MRN: Sela Hilding Date of Birth: 2001-07-02

## 2021-02-15 ENCOUNTER — Ambulatory Visit: Payer: Medicaid Other

## 2021-02-15 ENCOUNTER — Other Ambulatory Visit: Payer: Self-pay

## 2021-02-15 DIAGNOSIS — M25562 Pain in left knee: Secondary | ICD-10-CM

## 2021-02-15 DIAGNOSIS — R262 Difficulty in walking, not elsewhere classified: Secondary | ICD-10-CM | POA: Diagnosis not present

## 2021-02-15 DIAGNOSIS — M6281 Muscle weakness (generalized): Secondary | ICD-10-CM | POA: Diagnosis not present

## 2021-02-15 DIAGNOSIS — M25561 Pain in right knee: Secondary | ICD-10-CM | POA: Diagnosis not present

## 2021-02-15 DIAGNOSIS — G8929 Other chronic pain: Secondary | ICD-10-CM | POA: Diagnosis not present

## 2021-02-15 NOTE — Therapy (Signed)
[facility] Outpatient Rehabilitation [facility] [address] [place], [place], [address] Phone: [phone]   Fax:  [phone]  Physical Therapy Treatment  Patient Details  Name: Tammy Jackson MRN: [id number] Date of Birth: [birth year] Referring Provider (PT): McDiarmid, Leighton Roach, MD   Encounter Date: 02/15/2021   PT End of Session - 02/15/21 1300    Visit Number 9    Number of Visits 13    Date for PT Re-Evaluation 03/03/21    Authorization Type [place] MEDICAID HEALTHY BLUE    Authorization Time Period 4/5-5/14/22    Authorization - Visit Number 8    Authorization - Number of Visits 12    Progress Note Due on Visit 10    PT Start Time 1250    PT Stop Time 1332    PT Time Calculation (min) 42 min    Activity Tolerance Patient tolerated treatment well    Behavior During Therapy [facility] for tasks assessed/performed           Past Medical History:  Diagnosis Date  . Asthma     History reviewed. No pertinent surgical history.  There were no vitals filed for this visit.   Subjective Assessment - 02/15/21 1252    Subjective Pt reports  knee pain of a 2/10 which she thinks is related to her crossing her legs.    Patient Stated Goals For my knee to be fixed    Currently in Pain? Yes    Pain Score 2     Pain Location Knee    Pain Orientation Right    Pain Descriptors / Indicators Aching    Pain Type Chronic pain    Pain Onset More than a month ago    Pain Frequency Occasional    Multiple Pain Sites Yes    Pain Score 0    Pain Location Knee    Pain Orientation Left                             [facility] Adult PT Treatment/Exercise - 02/15/21 0001      Knee/Hip Exercises: Stretches   Active Hamstring Stretch Right;Left;1 rep;30 seconds    Gastroc Stretch 1 rep;30 seconds    Gastroc Stretch Limitations standing      Knee/Hip Exercises: Aerobic   Nustep 5 mins; L5; LEs      Knee/Hip Exercises: Standing   Lateral Step Up Right;Left;2  sets;10 reps;Hand Hold: 0    Forward Step Up Right;Left;2 sets;10 reps;Hand Hold: 0    Forward Step Up Limitations high knee    Wall Squat 10 reps;2 sets      Knee/Hip Exercises: Seated   Long Arc Quad Right;Left;2 sets;10 reps    Long Arc Quad Weight 4 lbs.      Knee/Hip Exercises: Supine   Straight Leg Raises Right;Left;10 reps    Straight Leg Raises Limitations 4 lbs    Other Supine Knee/Hip Exercises Clams 15x; blue Tband      Knee/Hip Exercises: Sidelying   Hip ABduction Right;Left;10 reps    Hip ABduction Limitations 4 lbs                    PT Short Term Goals - 02/08/21 1334      PT SHORT TERM GOAL #1   Title Pt will be Ind in an initial HEP    Baseline Started on eval    Status Achieved    Target Date 02/08/21  PT SHORT TERM GOAL #2   Title Pt will voice understanding of measures and techniques to assist in pain reduction and management    Status Achieved    Target Date 02/08/21             PT Long Term Goals - 02/15/21 1405      PT LONG TERM GOAL #1   Title Pt will be Ind in a final HEP to maintain or progress achieved LOF    Status On-going    Target Date 03/03/21      PT LONG TERM GOAL #2   Title Pt will be able to walk 20 to 30 mins and asc/dsc steps with decreased intermittent knee pain of 2/10 or less 02/15/21: Pt is working 8 hour shifts standing c no to min knee pain.    Status On-going    Target Date 03/03/21      PT LONG TERM GOAL #3   Title Pt will be able to complete 5 partial SL sit to stands from an appropriate height with the R demonstrating equal control/stability to the L. 02/16/21: pt tolerated laterl step and forward step up exs.    Status On-going    Target Date 03/03/21      PT LONG TERM GOAL #4   Title Pt's R hip flexors and hip extensors will demostrate 4+ to 5/5 strength and pt will report the feeling the R quad and hamstring strength being equal to the L.    Baseline R hip flexors and extensors 4/5, pt reports her R  quads and hamstrings feel weaker than the L although they test at 5/5    Status On-going    Target Date 03/03/21                 Plan - 02/15/21 1259    Clinical Impression Statement PT was provided for bilat knee and LE strengthening. Ther ex were progressed with the SL closed kinetic chain exs provided. Pt tolerated the session well reporting the R knee pain she was experiencing prior to PT had resolved. Pt will continue to benefit from the progression of strengthening exs for B LEs to decrease pain and to optimize function.    Personal Factors and Comorbidities Time since onset of injury/illness/exacerbation;Comorbidity 1    Comorbidities High BMI    Examination-Activity Limitations Locomotion Level;Stairs;Squat;Bend    Stability/Clinical Decision Making Stable/Uncomplicated    Clinical Decision Making Low    Rehab Potential Good    PT Frequency 2x / week    PT Duration 6 weeks    PT Treatment/Interventions ADLs/Self Care Home Management;Cryotherapy;Electrical Stimulation;Ultrasound;Moist Heat;Iontophoresis 4mg /ml Dexamethasone;Gait training;Stair training;Functional mobility training;Therapeutic activities;Therapeutic exercise;Balance training;Manual techniques;Patient/family education;Passive range of motion;Dry needling;Taping;Joint Manipulations    PT Next Visit Plan Continue to progress knee and hip strengthening as tolerated with closed and open chain strengthening, Assess LTGs    PT Home Exercise Plan    Consulted and Agree with Plan of Care Patient           Patient will benefit from skilled therapeutic intervention in order to improve the following deficits and impairments:  Difficulty walking,Obesity,Decreased activity tolerance,Pain,Decreased strength  Visit Diagnosis: Chronic pain of both knees  Difficulty in walking, not elsewhere classified  Muscle weakness (generalized)     Problem List Patient Active Problem List   Diagnosis Date Noted  .  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 Bilateral knee pain 05/12/2020  . Abnormal uterine bleeding (AUB) 07/25/2019  . Symptomatic anemia 07/25/2019  Joellyn Rued MS, PT 02/15/21 2:20 PM  [facility] [address] [place], [place], [address] Phone: [phone]   Fax:  [phone]  Name: Tammy Jackson MRN: [id number] Date of Birth: 2000/11/11

## 2021-02-20 ENCOUNTER — Other Ambulatory Visit: Payer: Self-pay

## 2021-02-20 ENCOUNTER — Ambulatory Visit: Payer: Medicaid Other

## 2021-02-20 DIAGNOSIS — M25561 Pain in right knee: Secondary | ICD-10-CM | POA: Diagnosis not present

## 2021-02-20 DIAGNOSIS — R262 Difficulty in walking, not elsewhere classified: Secondary | ICD-10-CM | POA: Diagnosis not present

## 2021-02-20 DIAGNOSIS — G8929 Other chronic pain: Secondary | ICD-10-CM | POA: Diagnosis not present

## 2021-02-20 DIAGNOSIS — M6281 Muscle weakness (generalized): Secondary | ICD-10-CM

## 2021-02-20 DIAGNOSIS — M25562 Pain in left knee: Secondary | ICD-10-CM | POA: Diagnosis not present

## 2021-02-20 NOTE — Therapy (Signed)
[place], [place], [address] Phone: [phone]   Fax:  [phone]  Physical Therapy Treatment/Discharge/Progress Note  Patient Details  Name: Tammy Jackson MRN: [id number] Date of Birth: [birth year] Referring Provider (PT): McDiarmid, Blane Ohara, MD           Progress Note Reporting Period 01/10/21 to 02/20/21  See note below for Objective Data and Assessment of Progress/Goals.       Encounter Date: 02/20/2021   PT End of Session - 02/20/21 1231    Visit Number 10    Number of Visits 13    Date for PT Re-Evaluation 03/03/21    Authorization Type [place] MEDICAID HEALTHY BLUE    Authorization Time Period 4/5-5/14/22    Authorization - Visit Number 9    Authorization - Number of Visits 12    Progress Note Due on Visit 10    PT Start Time 1221    PT Stop Time 1305    PT Time Calculation (min) 44 min    Activity Tolerance Patient tolerated treatment well    Behavior During Therapy WFL for tasks assessed/performed           Past Medical History:  Diagnosis Date  . Asthma     History reviewed. No pertinent surgical history.  There were no vitals filed for this visit.   Subjective Assessment - 02/20/21 1343    Subjective Pt reports she has not experienced any knee pain since the last PT session and is ready to complete her course od care    Patient Stated Goals For my knee to be fixed    Currently in Pain? No/denies    Pain Score 0-No pain    Pain Location Knee    Pain Orientation Right;Left                             [facility] Adult PT Treatment/Exercise - 02/20/21 0001      Knee/Hip Exercises: Standing   Heel Raises Both;10 reps    Knee Flexion Right;Left;5 reps    Knee Flexion Limitations green Tband    Hip Abduction Right;Left;5 reps    Abduction Limitations green Tband    Hip Extension Right;Left;5 reps    Extension Limitations green Tband      Knee/Hip  Exercises: Seated   Long Arc Quad Right;Left;10 reps;1 set    [employer] Weight 4 lbs.      Knee/Hip Exercises: Supine   Straight Leg Raises Right;Left;10 reps    Straight Leg Raises Limitations 4 lbs    Other Supine Knee/Hip Exercises Clams 15x; blue Tband      Knee/Hip Exercises: Sidelying   Hip ABduction Right;Left;10 reps    Hip ABduction Limitations 4 lbs                  PT Education - 02/20/21 1327    Education Details Final HEP provided and reviewd    Person(s) Educated Patient    Methods Explanation;Demonstration;Tactile cues;Verbal cues;Handout    Comprehension Verbalized understanding;Returned demonstration;Verbal cues required;Tactile cues required            PT Short Term Goals - 02/08/21 1334      PT SHORT TERM GOAL #1   Title Pt will be Ind in an initial HEP    Baseline Started on eval    Status Achieved    Target Date 02/08/21  PT SHORT TERM GOAL #2   Title Pt will voice understanding of measures and techniques to assist in pain reduction and management    Status Achieved    Target Date 02/08/21             PT Long Term Goals - 02/20/21 1332      PT LONG TERM GOAL #1   Title Pt will be Ind in a final HEP to maintain or progress achieved LOF    Status Achieved    Target Date 02/20/21      PT LONG TERM GOAL #2   Title Pt will be able to walk 20 to 30 mins and asc/dsc steps with decreased intermittent knee pain of 2/10 or less 02/15/21: Pt is working 8 hour shifts standing c no to min knee pain. 02/20/21: Pt dsc/asc 20 steps s knee pain and denies knee pain with walking 30 mins.    Baseline 5/10    Status Achieved    Target Date 02/20/21      PT LONG TERM GOAL #3   Title Pt will be able to complete 5 partial SL sit to stands from an appropriate height with the R demonstrating equal control/stability to the L. 02/16/21: pt tolerated laterl step and forward step up exs. 02/20/21: Pt completed 5 partial SLS STS s knee pain.    Status  Achieved    Target Date 02/20/21      PT LONG TERM GOAL #4   Title Pt's R hip flexors and hip extensors will demostrate 4+ to 5/5 strength and pt will report the feeling the R quad and hamstring strength being equal to the L. 02/20/21: MMT for R hip flexors, extensors, and abductors =4+/5 and R quad and hamstring =5/5 with pt reporting equal strength to the L.    Baseline R hip flexors and extensors 4/5, pt reports her R quads and hamstrings feel weaker than the L although they test at 5/5    Status Achieved    Target Date 02/20/21      PT LONG TERM GOAL #5   Status Achieved                 Plan - 02/20/21 1243    Clinical Impression Statement Pt has made good progress with bilat knee pain and function. Pt has met all set goals, see below. Pt is pleased with her progress and is ready to be DCed to her HEP.    Personal Factors and Comorbidities Time since onset of injury/illness/exacerbation;Comorbidity 1    Comorbidities High BMI    Examination-Activity Limitations Locomotion Level;Stairs;Squat;Bend    Stability/Clinical Decision Making Stable/Uncomplicated    Clinical Decision Making Low    Rehab Potential Good    PT Frequency 2x / week    PT Duration 6 weeks    PT Treatment/Interventions ADLs/Self Care Home Management;Cryotherapy;Electrical Stimulation;Ultrasound;Moist Heat;Iontophoresis 34m/ml Dexamethasone;Gait training;Stair training;Functional mobility training;Therapeutic activities;Therapeutic exercise;Balance training;Manual techniques;Patient/family education;Passive range of motion;Dry needling;Taping;Joint Manipulations    PT Next Visit Plan - Final HEP    PT Home Exercise Plan [id number]   Consulted and Agree with Plan of Care Patient           Patient will benefit from skilled therapeutic intervention in order to improve the following deficits and impairments:  Difficulty walking,Obesity,Decreased activity tolerance,Pain,Decreased strength  Visit  Diagnosis: Chronic pain of both knees  Difficulty in walking, not elsewhere classified  Muscle weakness (generalized)     Problem List Patient Active Problem List  Diagnosis Date Noted  . 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 Bilateral knee pain 05/12/2020  . Abnormal uterine bleeding (AUB) 07/25/2019  . Symptomatic anemia 07/25/2019   PHYSICAL THERAPY DISCHARGE SUMMARY  Visits from Start of Care: 10  Current functional level related to goals / functional outcomes: See above   Remaining deficits: See above   Education / Equipment: HEP Plan: Patient agrees to discharge.  Patient goals were met. Patient is being discharged due to meeting the stated rehab goals.  ?????           Gar Ponto MS, PT 02/20/21 1:45 PM   [facility] [address] [place], [place], [address] Phone: [phone]   Fax:  [phone]  Name: MARIONA SCHOLES MRN: [id number] Date of Birth: 06/08/2001

## 2021-02-22 ENCOUNTER — Ambulatory Visit: Payer: Medicaid Other

## 2021-03-08 ENCOUNTER — Ambulatory Visit (INDEPENDENT_AMBULATORY_CARE_PROVIDER_SITE_OTHER): Payer: Medicaid Other | Admitting: Family Medicine

## 2021-03-08 ENCOUNTER — Other Ambulatory Visit: Payer: Self-pay

## 2021-03-08 ENCOUNTER — Other Ambulatory Visit (HOSPITAL_COMMUNITY)
Admission: RE | Admit: 2021-03-08 | Discharge: 2021-03-08 | Disposition: A | Discharge status: Home / Self Care | Payer: Medicaid Other | Source: Ambulatory Visit | Attending: Family Medicine | Admitting: Family Medicine

## 2021-03-08 ENCOUNTER — Encounter: Payer: Self-pay | Admitting: Family Medicine

## 2021-03-08 VITALS — BP 122/80 | HR 87 | Wt 206.6 lb

## 2021-03-08 DIAGNOSIS — Z113 Encounter for screening for infections with a predominantly sexual mode of transmission: Secondary | ICD-10-CM | POA: Insufficient documentation

## 2021-03-08 LAB — POCT WET PREP (WET MOUNT)
Clue Cells Wet Prep Whiff POC: POSITIVE
Trichomonas Wet Prep HPF POC: ABSENT

## 2021-03-08 LAB — POCT URINE PREGNANCY: Preg Test, Ur: NEGATIVE

## 2021-03-08 NOTE — Patient Instructions (Addendum)
It was good to see you today.  Thank you for coming in.  We screened you today for Gonnorhea, Chlamydia, HIV, Hepatitis C, Syphillis, and Trichomonas.  I will call you with the results.  Be Well, Dr Pecola Leisure

## 2021-03-09 LAB — HEPATITIS C ANTIBODY: Hep C Virus Ab: <0.1 s/co ratio (ref 0.0–0.9)

## 2021-03-09 LAB — RPR: RPR Ser Ql: NONREACTIVE

## 2021-03-09 LAB — CERVICOVAGINAL ANCILLARY ONLY
Chlamydia: POSITIVE — AB
Comment: NEGATIVE
Comment: NORMAL
Neisseria Gonorrhea: NEGATIVE

## 2021-03-09 LAB — HIV ANTIBODY (ROUTINE TESTING W REFLEX): HIV Screen 4th Generation wRfx: NONREACTIVE

## 2021-03-10 ENCOUNTER — Encounter: Payer: Self-pay | Admitting: Family Medicine

## 2021-03-10 DIAGNOSIS — Z113 Encounter for screening for infections with a predominantly sexual mode of transmission: Secondary | ICD-10-CM | POA: Insufficient documentation

## 2021-03-10 MED ORDER — METRONIDAZOLE 0.75 % VA GEL: 1.0000 | Freq: Every day | VAGINAL | 0 refills | Status: AC

## 2021-03-10 MED ORDER — DOXYCYCLINE HYCLATE 100 MG PO TABS: 100.0000 mg | ORAL_TABLET | Freq: Two times a day (BID) | ORAL | 0 refills | Status: AC

## 2021-03-10 NOTE — Assessment & Plan Note (Signed)
Patient presenting with request for STI screening.  Indicates currently has 1 female partner.  Currently uses OCP's for birth control.  Pelvic Exam and swab performed by Dr Lum Babe as patient requested female provider to perform.  Obained Beta-hCG, HIV RPR, Hep C, Wet Prep, and G/C swab -  Follow-up results/possible treatment with patient

## 2021-03-10 NOTE — Progress Notes (Signed)
    SUBJECTIVE:   CHIEF COMPLAINT / HPI:   Patient presenting with request for STI screening.  Indicates currently has 1 female partner.  Currently uses OCP's for birth control.  Denies any vaginal discharge, pain, dysuria, foul smell or change in urinary frequency.  PERTINENT  PMH / PSH:  None  OBJECTIVE:   BP 122/80   Pulse 87   Wt 206 lb 9.6 oz (93.7 kg)   LMP 02/02/2021   SpO2 99%   BMI 35.46 kg/m    Physical Exam Constitutional:      Appearance: Normal appearance.  Genitourinary:    Comments: Sensitive pelvic exam preformed by Dr Lum Babe as patient reqiested female provider. Neurological:     Mental Status: She is alert.     ASSESSMENT/PLAN:   Routine screening for STI (sexually transmitted infection) Patient presenting with request for STI screening.  Indicates currently has 1 female partner.  Currently uses OCP's for birth control.  Pelvic Exam and swab performed by Dr Lum Babe as patient requested female provider to perform.  Obained Beta-hCG, HIV RPR, Hep C, Wet Prep, and G/C swab -  Follow-up results/possible treatment with patient      Jovita Kussmaul, MD [facility]

## 2021-03-10 NOTE — Addendum Note (Signed)
Addended by: Janit Pagan T on: 03/10/2021 05:40 PM   Modules accepted: Orders

## 2021-03-12 NOTE — Progress Notes (Signed)
Patient called regarding positive Chlamydia.  Indicated was able to obtain Doxycycline from Pharmacy.  Spoke with patient regarding Expedited Partner Therapy.  Unable to acquire necessary information to perform this process, and she indicated she is currently unable to contact partner.  Instructed if she is able to get in touch with partner to recommend he get testing and treatment.  Instructed patient to call or follow-up if any further issues.

## 2021-04-25 ENCOUNTER — Other Ambulatory Visit (HOSPITAL_COMMUNITY)
Admission: RE | Admit: 2021-04-25 | Discharge: 2021-04-25 | Disposition: A | Discharge status: Home / Self Care | Payer: Medicaid Other | Source: Ambulatory Visit | Attending: Family Medicine | Admitting: Family Medicine

## 2021-04-25 ENCOUNTER — Other Ambulatory Visit: Payer: Self-pay

## 2021-04-25 ENCOUNTER — Ambulatory Visit (INDEPENDENT_AMBULATORY_CARE_PROVIDER_SITE_OTHER): Payer: Medicaid Other | Admitting: Family Medicine

## 2021-04-25 VITALS — BP 110/62 | HR 98 | Wt 203.6 lb

## 2021-04-25 DIAGNOSIS — Z114 Encounter for screening for human immunodeficiency virus [HIV]: Secondary | ICD-10-CM

## 2021-04-25 DIAGNOSIS — Z113 Encounter for screening for infections with a predominantly sexual mode of transmission: Secondary | ICD-10-CM | POA: Insufficient documentation

## 2021-04-25 DIAGNOSIS — N898 Other specified noninflammatory disorders of vagina: Secondary | ICD-10-CM | POA: Diagnosis not present

## 2021-04-25 DIAGNOSIS — Z1159 Encounter for screening for other viral diseases: Secondary | ICD-10-CM | POA: Diagnosis not present

## 2021-04-25 LAB — POCT WET PREP (WET MOUNT)
Clue Cells Wet Prep Whiff POC: NEGATIVE
Trichomonas Wet Prep HPF POC: ABSENT

## 2021-04-25 LAB — POCT URINE PREGNANCY: Preg Test, Ur: NEGATIVE

## 2021-04-25 MED ORDER — FLUCONAZOLE 150 MG PO TABS: 150.0000 mg | ORAL_TABLET | Freq: Once | ORAL | 0 refills | Status: AC

## 2021-04-25 NOTE — Patient Instructions (Signed)
It was a pleasure seeing you today!  We collected a sample today which showed you have a yeast infection.  We also sent off for other testing and those results will come back in a few days.  I want to treat the yeast infection with a medication called Diflucan.  You will take 1 pill and it should clear the yeast infection up.  If the symptoms do not resolve please call and we can send in another dose.  If any questions or concerns please feel free to call the clinic.  I hope you have a wonderful afternoon!

## 2021-04-25 NOTE — Progress Notes (Signed)
    SUBJECTIVE:   CHIEF COMPLAINT / HPI:   Vaginal discharge Patient reports abnormal vaginal discharge for several weeks.  Was sexually active 1 month ago but use protection with a condom.  Is concerned that she may have a yeast infection.  Is on birth control.  Denies any dysuria.  Denies any known sexual contact with someone who has an STI.  Would like to be checked for other STIs today as well.  OBJECTIVE:   BP 110/62   Pulse 98   Wt 203 lb 9.6 oz (92.4 kg)   LMP 03/20/2021   SpO2 98%   BMI 34.95 kg/m   General: Well-appearing 20 year old female, no acute distress Cardiac: Regular rate and rhythm, no murmurs appreciated Respiratory: Normal for breathing, lungs clear to auscultation bilaterally GU: Normal external female genitalia, normal internal vaginal mucosa without erythema or edema, considerable amount of white thick discharge noted.  ASSESSMENT/PLAN:   Routine screening for STI (sexually transmitted infection) Patient presenting for abnormal vaginal discharge.  Would like STI screening but is concerned she may have yeast infection.  Wet prep collected today showing yeast infection.  Patient given prescription for Diflucan.  Urine pregnancy test negative.  HIV, RPR, GC swab collected.  Strict return precautions given.  We will call patient with results if any abnormalities.     Derrel Nip, MD [facility]

## 2021-04-25 NOTE — Assessment & Plan Note (Signed)
Patient presenting for abnormal vaginal discharge.  Would like STI screening but is concerned she may have yeast infection.  Wet prep collected today showing yeast infection.  Patient given prescription for Diflucan.  Urine pregnancy test negative.  HIV, RPR, GC swab collected.  Strict return precautions given.  We will call patient with results if any abnormalities.

## 2021-04-26 LAB — CERVICOVAGINAL ANCILLARY ONLY
Chlamydia: NEGATIVE
Comment: NEGATIVE
Comment: NEGATIVE
Comment: NORMAL
Neisseria Gonorrhea: NEGATIVE
Trichomonas: NEGATIVE

## 2021-05-28 ENCOUNTER — Encounter: Payer: Self-pay | Admitting: Family Medicine

## 2021-05-30 NOTE — Telephone Encounter (Signed)
If patient is concerned about pregnancy she will need to take a pregnancy test.  Dana Allan, MD [facility]

## 2021-06-05 ENCOUNTER — Encounter: Payer: Self-pay | Admitting: Family Medicine

## 2021-06-06 NOTE — Telephone Encounter (Signed)
Called patient. Patient reports that she has been having nausea, bloating and intermittent dizziness for the past week.   Of note, patient reports having irregular period after unprotected intercourse on 8/4.Patient reports that period was supposed to be on 8/23. Patient reports that on 8/4 she had light spotting, followed by light bleeding for a few days. Reports that this was lighter than usual period.   Scheduled patient follow up appointment on 9/2. Recommended repeat home pregnancy test, given irregular bleeding and timing.   Provided with strict ED precautions.   Veronda Prude, RN

## 2021-06-15 ENCOUNTER — Ambulatory Visit: Payer: Medicaid Other

## 2021-06-15 DIAGNOSIS — R5383 Other fatigue: Secondary | ICD-10-CM

## 2021-06-15 DIAGNOSIS — R42 Dizziness and giddiness: Secondary | ICD-10-CM

## 2021-06-15 DIAGNOSIS — Z113 Encounter for screening for infections with a predominantly sexual mode of transmission: Secondary | ICD-10-CM

## 2021-06-15 DIAGNOSIS — Z1159 Encounter for screening for other viral diseases: Secondary | ICD-10-CM

## 2021-06-25 ENCOUNTER — Ambulatory Visit: Payer: Medicaid Other | Admitting: Family Medicine

## 2021-06-26 ENCOUNTER — Encounter: Payer: Self-pay | Admitting: Family Medicine

## 2021-06-29 ENCOUNTER — Other Ambulatory Visit: Payer: Self-pay | Admitting: Family Medicine

## 2021-06-29 DIAGNOSIS — Z0100 Encounter for examination of eyes and vision without abnormal findings: Secondary | ICD-10-CM

## 2021-06-29 DIAGNOSIS — E119 Type 2 diabetes mellitus without complications: Secondary | ICD-10-CM

## 2021-06-29 NOTE — Telephone Encounter (Signed)
Referral has been sent.

## 2021-07-06 ENCOUNTER — Ambulatory Visit (INDEPENDENT_AMBULATORY_CARE_PROVIDER_SITE_OTHER): Payer: Medicaid Other | Admitting: Family Medicine

## 2021-07-06 ENCOUNTER — Other Ambulatory Visit (HOSPITAL_COMMUNITY)
Admission: RE | Admit: 2021-07-06 | Discharge: 2021-07-06 | Disposition: A | Discharge status: Home / Self Care | Payer: Medicaid Other | Source: Ambulatory Visit | Attending: Family Medicine | Admitting: Family Medicine

## 2021-07-06 ENCOUNTER — Other Ambulatory Visit: Payer: Self-pay

## 2021-07-06 VITALS — BP 112/78 | HR 99 | Wt 207.0 lb

## 2021-07-06 DIAGNOSIS — Z862 Personal history of diseases of the blood and blood-forming organs and certain disorders involving the immune mechanism: Secondary | ICD-10-CM

## 2021-07-06 DIAGNOSIS — R519 Headache, unspecified: Secondary | ICD-10-CM | POA: Diagnosis not present

## 2021-07-06 DIAGNOSIS — Z113 Encounter for screening for infections with a predominantly sexual mode of transmission: Secondary | ICD-10-CM | POA: Diagnosis not present

## 2021-07-06 LAB — POCT WET PREP (WET MOUNT)
Clue Cells Wet Prep Whiff POC: NEGATIVE
Trichomonas Wet Prep HPF POC: ABSENT

## 2021-07-06 MED ORDER — MAGNESIUM OXIDE 400 MG PO CAPS: 400.0000 mg | ORAL_CAPSULE | Freq: Every day | ORAL | 0 refills | Status: DC

## 2021-07-06 NOTE — Patient Instructions (Addendum)
It was great seeing you today!  Today you came in for migraines and to get tested for STIs.  Prescribed magnesium oxide 400 mg to take daily until headaches subside, and then can take on an as-needed basis.  With regards to the STI testing, I will call you with any abnormal results, and if normal will send a MyChart message.  Regarding lab work today:  Due to recent changes in healthcare laws, you may see the results of your imaging and laboratory studies on MyChart before your provider has had a chance to review them.  I understand that in some cases there may be results that are confusing or concerning to you. Not all laboratory results come back in the same time frame and you may be waiting for multiple results in order to interpret others.  Please give [place] 72 hours in order for your provider to thoroughly review all the results before contacting the office for clarification of your results. If everything is normal, you will get a letter in the mail or a message in My Chart. Please give [place] a call if you do not hear from [place] after 2 weeks.  Feel free to call with any questions or concerns at any time, at [phone].   Take care,  Dr. Cora Collum [facility]

## 2021-07-06 NOTE — Progress Notes (Signed)
     SUBJECTIVE:   Tammy Jackson is a 20 y.o. female who complains of intermittent headaches for 2 week(s). Description of pain: unilateral in the right frontal area not worsened by light or sound.  Denies associated symptoms. Denies recent stressors. Has been drinking plenty of water. Pain relief: unable to obtain relief with OTC meds. Relieved with sleep. She denies a history of recent head injury. Denies neurological complaints.  Patient also endorses recent unprotected sex with new partner. Denies vaginal or urinary symptoms. Requesting for STD check. Takes daily OCP and has not missed a day, so not concerned about pregnancy.   OBJECTIVE:   BP 112/78   Pulse 99   Wt 207 lb (93.9 kg)   LMP 06/22/2021 (Exact Date)   SpO2 99%   BMI 35.53 kg/m    General: NAD, pleasant, able to participate in exam Respiratory: Normal effort, no obvious respiratory distress Neuro: CN 2-12 intact. No focal deficits. Normal sensation bilaterally Pelvic: VULVA: normal appearing vulva with no masses, tenderness or lesions, VAGINA: Normal appearing vagina with normal color, no lesions, with scant discharge present, CERVIX: No lesions, scant discharge present,  Chaperone Research officer, trade [employer] present for pelvic exam  ASSESSMENT/PLAN:   No problem-specific Assessment & Plan notes found for this encounter.   Headaches  Patient endorses intermittent headaches primarily in right frontal region for the past 2 weeks. Not relieved by OTC Tylenol or Ibuprofen. Physical exam unremarkable and without neuro deficits. Likely migraine given unilateral nature without aura. - magnesium oxide 400mg  daily - can consider prescribing stronger NSAID for short duration if not relieved by magnesium  STI screen  Recent unprotected intercourse with new partner. No vaginal or urinary symptoms. Takes OCPs daily, no concern for pregnancy. Physical exam unremarkable with minimal clear discharge. - encourage condom use  -Wet  prep -GC/chlamydia  -Will check HIV and RPR  Hx iron deficiency anemia States she has been taking iron daily chronically, but has been out of iron and is requesting refill. CBC last checked in 2020. Will check hgb and ferritin, and assess need for continued iron.    2021, DO [facility]

## 2021-07-07 LAB — CBC
Hematocrit: 35.7 % (ref 34.0–46.6)
Hemoglobin: 11.5 g/dL (ref 11.1–15.9)
MCH: 26.3 pg — ABNORMAL LOW (ref 26.6–33.0)
MCHC: 32.2 g/dL (ref 31.5–35.7)
MCV: 82 fL (ref 79–97)
Platelets: 266 10*3/uL (ref 150–450)
RBC: 4.37 x10E6/uL (ref 3.77–5.28)
RDW: 13.4 % (ref 11.7–15.4)
WBC: 6.3 10*3/uL (ref 3.4–10.8)

## 2021-07-07 LAB — RPR: RPR Ser Ql: NONREACTIVE

## 2021-07-07 LAB — HIV ANTIBODY (ROUTINE TESTING W REFLEX): HIV Screen 4th Generation wRfx: NONREACTIVE

## 2021-07-07 LAB — FERRITIN: Ferritin: 66 ng/mL (ref 15–77)

## 2021-07-09 LAB — CERVICOVAGINAL ANCILLARY ONLY
Chlamydia: POSITIVE — AB
Comment: NEGATIVE
Comment: NORMAL
Neisseria Gonorrhea: NEGATIVE

## 2021-07-11 ENCOUNTER — Other Ambulatory Visit: Payer: Self-pay | Admitting: Family Medicine

## 2021-07-11 ENCOUNTER — Telehealth: Payer: Self-pay

## 2021-07-11 NOTE — Telephone Encounter (Signed)
Patient calls nurse line to discuss results from recent visit.   Please advise.   Veronda Prude, RN

## 2021-07-13 MED ORDER — DOXYCYCLINE HYCLATE 100 MG PO TABS: 100.0000 mg | ORAL_TABLET | Freq: Two times a day (BID) | ORAL | 0 refills | Status: AC

## 2021-07-13 NOTE — Addendum Note (Signed)
Addended by: Cora Collum on: 07/13/2021 03:25 PM   Modules accepted: Orders

## 2021-07-24 ENCOUNTER — Other Ambulatory Visit: Payer: Self-pay | Admitting: Family Medicine

## 2021-07-24 NOTE — Progress Notes (Signed)
Error in encounter

## 2021-08-21 NOTE — Progress Notes (Signed)
    SUBJECTIVE:   CHIEF COMPLAINT / HPI:   STD screening: Patient is a 20 y.o. female presenting for STD testing.  She denies any vaginal discharge, itching, odor.  She was previously positive for chlamydia on 07/06/2021 and received treatment.  She states that her partner also received treatment.  She denies any symptoms currently.  Concern for pregnancy: Patient request testing for pregnancy today.  She is not having any symptoms but just wants to test to be sure.  She has been taking OCPs for birth control but stopped them recently due to an increase in appetite.  She still has some at home.  She states that she is not overly concerned about pregnancy or if she were to become pregnant or not over the next 1 year.  Discussed various birth control options.  PERTINENT  PMH / PSH: None relevant  OBJECTIVE:   BP 123/82   Pulse 85   Ht 5\' 4"  (1.626 m)   Wt 208 lb (94.3 kg)   LMP 08/10/2021   SpO2 100%   BMI 35.70 kg/m    General: NAD, pleasant, able to participate in exam Respiratory: Normal effort, no obvious respiratory distress Pelvic: VULVA: normal appearing vulva with no masses, tenderness or lesions, VAGINA: Normal appearing vagina with normal color, no lesions, with scant discharge present., Chaperone Alexis present for pelvic exam  ASSESSMENT/PLAN:   Possible pregnancy: Pregnancy test today negative.  Patient previously taking OCPs but stopped it due to increase of appetite while on these.  She is not overly concerned about pregnancy or becoming pregnant over the next year at this time.  I did discuss that we can provide various options for contraception including Depo-Provera, IUD, Nexplanon, or continue with OCPs.  Patient ultimately decided to resume birth control pills.  She states she has plenty of these at home today.  I expressed that she has a negative pregnancy test today she can go ahead and start these today.  Screening for STDs: 20 y.o. female for routine STD  testing.  She was previously noted with positive for chlamydia on 07/06/2021 but did receive treatment.  She endorses no vaginal discharge, vaginal itching, vaginal odor.  We will test for trichomonas, GC, chlamydia.  Patient is also interested in doing blood work for HIV and RPR.   Plan: -GC/chlamydia/trichomonas pending -Will check HIV and RPR  Concern for pregnancy:   07/08/2021, DO [facility]

## 2021-08-22 ENCOUNTER — Other Ambulatory Visit (HOSPITAL_COMMUNITY)
Admission: RE | Admit: 2021-08-22 | Discharge: 2021-08-22 | Disposition: A | Discharge status: Home / Self Care | Payer: Medicaid Other | Source: Ambulatory Visit | Attending: Family Medicine | Admitting: Family Medicine

## 2021-08-22 ENCOUNTER — Ambulatory Visit (INDEPENDENT_AMBULATORY_CARE_PROVIDER_SITE_OTHER): Payer: Medicaid Other | Admitting: Family Medicine

## 2021-08-22 ENCOUNTER — Other Ambulatory Visit: Payer: Self-pay

## 2021-08-22 VITALS — BP 123/82 | HR 85 | Ht 64.0 in | Wt 208.0 lb

## 2021-08-22 DIAGNOSIS — Z113 Encounter for screening for infections with a predominantly sexual mode of transmission: Secondary | ICD-10-CM

## 2021-08-22 DIAGNOSIS — Z3200 Encounter for pregnancy test, result unknown: Secondary | ICD-10-CM

## 2021-08-22 LAB — POCT URINE PREGNANCY: Preg Test, Ur: NEGATIVE

## 2021-08-22 NOTE — Patient Instructions (Signed)
We are testing you for gonorrhea/chlamydia/trichomonas/HIV/syphilis today.  I will send a MyChart message with your results if they are negative we will give you a call if we need to do any treatments.  We performed a pregnancy test today.  If you are hoping to avoid pregnancy over the next year I do suggest further consideration for birth control.  We discussed various options today and we are happy to help you start any of these options if you are interested in them.

## 2021-08-23 LAB — CERVICOVAGINAL ANCILLARY ONLY
Chlamydia: NEGATIVE
Comment: NEGATIVE
Comment: NEGATIVE
Comment: NORMAL
Neisseria Gonorrhea: NEGATIVE
Trichomonas: NEGATIVE

## 2021-08-23 LAB — HIV ANTIBODY (ROUTINE TESTING W REFLEX): HIV Screen 4th Generation wRfx: NONREACTIVE

## 2021-08-23 LAB — RPR: RPR Ser Ql: NONREACTIVE

## 2021-08-27 ENCOUNTER — Encounter: Payer: Self-pay | Admitting: Family Medicine

## 2021-08-27 NOTE — Telephone Encounter (Signed)
Called patient to discuss further.   Patient reports being awoken by "hard, thumping" heart beat. Patient reports that it felt like she was holding her breath and that symptoms improved after "fully waking up." Patient does not remember if she was having a bad dream prior to awakening.   Patient reports that she is now feeling better. Denies chest pain or shortness of breath currently.   Patient provided with return/ ED precautions.   Veronda Prude, RN

## 2021-08-27 NOTE — Telephone Encounter (Signed)
Agree with excellent evaluation and plan  Thank you

## 2021-09-26 ENCOUNTER — Encounter: Payer: Self-pay | Admitting: Family Medicine

## 2021-12-17 ENCOUNTER — Ambulatory Visit: Payer: Medicaid Other

## 2021-12-17 ENCOUNTER — Ambulatory Visit: Payer: Medicaid Other | Admitting: Family Medicine

## 2021-12-17 NOTE — Progress Notes (Deleted)
? ? ? ?  SUBJECTIVE:  ? ?CHIEF COMPLAINT / HPI:  ? ?Tammy Jackson is a 21 y.o. female presents for knee pain  ? ?Knee pain ?*** pain started on ***. Onset was sudden/gradual ***. Radiates to ***. Described as *** type of pain. Time/duration ***. Alleviated by ***. Exacerbated by ***. Severity *** /10. Associated symptoms include ***.  ? ? ?*** ? ?Flowsheet Row Office Visit from 08/22/2021 in [facility]  ?PHQ-9 Total Score 1  ? ?  ?  ? ?Health Maintenance Due  ?Topic  ? URINE MICROALBUMIN   ? COVID-19 Vaccine (3 - Pfizer risk series)  ? ?  ? ?PERTINENT  PMH / PSH: AUB, erythema ab igne  ? ?OBJECTIVE:  ? ?There were no vitals taken for this visit.  ? ?General: Alert, no acute distress ?Cardio: Normal S1 and S2, RRR, no r/m/g ?Pulm: CTAB, normal work of breathing ?Abdomen: Bowel sounds normal. Abdomen soft and non-tender.  ?Extremities: No peripheral edema.  ?Neuro: Cranial nerves grossly intact  ? ?Knee: ?- Inspection: no gross deformity. No swelling/effusion, erythema or bruising. Skin intact ?- Palpation: no TTP ?- ROM: full active ROM with flexion and extension in knee and hip ?- Strength: 5/5 strength ?- Neuro/vasc: NV intact ?- Special Tests: ?- LIGAMENTS: negative anterior and posterior drawer, negative Lachman's, no MCL or LCL laxity  ?-- MENISCUS: negative McMurray's, negative Thessaly  ?-- PF JOINT: nml patellar mobility bilaterally.  negative patellar grind, negative patellar apprehension ? ?Hips: normal ROM, negative FABER and FADIR bilaterally  ? ?ASSESSMENT/PLAN:  ? ?No problem-specific Assessment & Plan notes found for this encounter. ?  ? ?Towanda Octave, MD PGY-3 ?[facility]  ?

## 2022-01-02 ENCOUNTER — Other Ambulatory Visit: Payer: Self-pay | Admitting: Family Medicine

## 2022-01-09 NOTE — Progress Notes (Deleted)
? ? ?  SUBJECTIVE:  ? ?CHIEF COMPLAINT / HPI: knee pain and swelling  ? ?Patient reports experiencing knee pain and swelling. She has completed a course of physical therapy with no change. These symptoms have been present for ***  ?She has tried *** to help with the pain ?Prior injuries/surgeries include ***  ?She is limited in her ability to *** ?She has not had recent imaging  ?She denies presence of fevers, redness at the joint, warm to touch  ? ?PERTINENT  PMH / PSH:  ?Bilateral knee pain  ? ?OBJECTIVE:  ? ?There were no vitals taken for this visit.  ?Knee: ?- Inspection: no gross deformity. No swelling/effusion, erythema or bruising. Skin intact ?- Palpation: no TTP ?- ROM: full active ROM with flexion and extension in knee and hip ?- Strength: 5/5 strength ?- Neuro/vasc: NV intact ?- Special Tests: ?- LIGAMENTS: negative anterior and posterior drawer, negative Lachman's, no MCL or LCL laxity  ?-- MENISCUS: negative McMurray's, negative Thessaly  ?-- PF JOINT: nml patellar mobility bilaterally.  negative patellar grind, negative patellar apprehension ? ?Hips: normal ROM, negative FABER and FADIR bilaterally ? ? ?ASSESSMENT/PLAN:  ? ?No problem-specific Assessment & Plan notes found for this encounter. ?  ? ? ?Ronnald Ramp, MD ?[facility]  ?

## 2022-01-10 ENCOUNTER — Ambulatory Visit: Payer: Medicaid Other

## 2022-01-11 ENCOUNTER — Ambulatory Visit (INDEPENDENT_AMBULATORY_CARE_PROVIDER_SITE_OTHER): Payer: Medicaid Other | Admitting: Family Medicine

## 2022-01-11 ENCOUNTER — Encounter: Payer: Self-pay | Admitting: Family Medicine

## 2022-01-11 VITALS — BP 124/70 | HR 99 | Ht 64.0 in | Wt 214.4 lb

## 2022-01-11 DIAGNOSIS — Z3200 Encounter for pregnancy test, result unknown: Secondary | ICD-10-CM

## 2022-01-11 DIAGNOSIS — M222X1 Patellofemoral disorders, right knee: Secondary | ICD-10-CM

## 2022-01-11 LAB — POCT URINE PREGNANCY: Preg Test, Ur: NEGATIVE

## 2022-01-11 NOTE — Progress Notes (Signed)
? ? ?  SUBJECTIVE:  ? ?CHIEF COMPLAINT / HPI:  ? ?Knee pain: ?First injured it years ago after slipping and falling. Has done physical therapy twice per week for 4 weeks and noticed some improvement. She was doing the exercises after PT. She noticed some pain and swelling yesterday. No recent trauma.  Bothers her most when going up or down stairs.  She does do some walking throughout the day. ? ?PERTINENT  PMH / PSH: None relevant ? ?OBJECTIVE:  ? ?BP 124/70   Pulse 99   Ht 5\' 4"  (1.626 m)   Wt 214 lb 6.4 oz (97.3 kg)   LMP 12/17/2021   SpO2 99%   BMI 36.80 kg/m?   ? ?General: NAD, pleasant, able to participate in exam ?Respiratory: No respiratory distress ?MSK: Knee, right: Inspection was negative for erythema, ecchymosis, and effusion. No obvious bony abnormalities or signs of osteophyte development. Palpation yielded no asymmetric warmth; mild medial joint line tenderness; No condyle tenderness; No patellar tenderness; No patellar crepitus. Patellar tendons with some tenderness during knee extension and with palpation. No tenderness of the pes anserine bursa. No obvious Baker's cyst development. ROM normal in flexion (135 degrees) and extension (0 degrees). Normal hamstring and quadriceps strength, however during quadricep strength testing does elicit some pain at the patella tendon. Neurovascularly intact bilaterally. ? ?Psych: Normal affect and mood ? ?ASSESSMENT/PLAN:  ? ?Patellofemoral syndrome: ?Bothers her most when going up or down stairs.  No recent traumas.  On physical exam she is got no erythema or swelling of the main knee joint but does have tenderness to palpation of the patellar tendon as well as discomfort in that region when providing knee extension particular in the last 20 degrees of knee extension.  No need for imaging at this time.  Physical exam most consistent with patellofemoral syndrome.  Discussed multiple exercises that she can do at home to help rehabilitate this issue.   Ultimately discussed that if these are not improving we can get her in physical therapy.  I did recommend trial of Voltaren/diclofenac gel for the discomfort as she is improving this.  She is can follow-up with me if she develops any change in symptoms, any warmth or erythema to the joint, or if it does not improve in the next 2 months. ?  ?Concern for pregnancy ?At the end of the appointment patient mentions that she has concerned that she could be pregnant.  She request a pregnancy test.  Point-of-care urine pregnancy test was ordered which was negative.  Patient left before I was able to discuss with her if she would be interested in contraception.  Recommend discussing this at follow-up appointment. ? ?02/16/2022, DO ?[facility]  ?

## 2022-01-11 NOTE — Patient Instructions (Signed)
Your knee pain is most consistent with patellofemoral syndrome.  I recommend getting some diclofenac/Voltaren gel over-the-counter to use when you have discomfort.  Ultimately doing the strengthening exercises will help improve this but it may take 2 months before you see a difference.  We do not need to do any injections or x-rays at this time but if your symptoms do not improve or change we can consider additional follow-ups.  If you get any warmth, fevers, or swelling of the joint you should see [place] back.  If it does not improve in 2 months you should see [place] back.  If you decide that you would like physical therapy I can always prescribe this and I do not need to see you back in order to do so.  Just let me know. ?

## 2022-01-19 ENCOUNTER — Encounter: Payer: Self-pay | Admitting: Family Medicine

## 2022-03-14 ENCOUNTER — Encounter: Payer: Self-pay | Admitting: Family Medicine

## 2022-03-18 ENCOUNTER — Ambulatory Visit (INDEPENDENT_AMBULATORY_CARE_PROVIDER_SITE_OTHER): Payer: Medicaid Other | Admitting: Family Medicine

## 2022-03-18 ENCOUNTER — Encounter: Payer: Self-pay | Admitting: Family Medicine

## 2022-03-18 VITALS — BP 120/79 | HR 76 | Ht 64.0 in | Wt 218.6 lb

## 2022-03-18 DIAGNOSIS — R519 Headache, unspecified: Secondary | ICD-10-CM

## 2022-03-18 DIAGNOSIS — H539 Unspecified visual disturbance: Secondary | ICD-10-CM | POA: Diagnosis not present

## 2022-03-18 DIAGNOSIS — R7303 Prediabetes: Secondary | ICD-10-CM | POA: Diagnosis not present

## 2022-03-18 LAB — POCT GLYCOSYLATED HEMOGLOBIN (HGB A1C): Hemoglobin A1C: 5.4 % (ref 4.0–5.6)

## 2022-03-18 MED ORDER — SUMATRIPTAN SUCCINATE 50 MG PO TABS: 50.0000 mg | ORAL_TABLET | ORAL | 0 refills | Status: DC | PRN

## 2022-03-18 NOTE — Progress Notes (Unsigned)
    SUBJECTIVE:   CHIEF COMPLAINT / HPI: headache  Patient reports new onset headache. Progressively getting worse over the past couple of weeks.  Starts in middle of forehead and radiates bilaterally to behind ears.  Feels like a tightening or squeezing sensation.  Headaches lasting minutes.  Reports were intermittent and now more frequent.  Endorses intermittent episodes of staring off into space for few mins, unknown length of staring spells. Reports phonophobia, nausea, watery eyes, frontal pressure. Denies any fevers, vomiting, runny nose, decrease in hearing, slurred speech, photophobia, weakness, numbness or tingling, pulsatile pain, tinnitus, LOC.  Sleeps about 8-9 hrs, eats 2 meals day, water intake 30 oz.  No caffeine, tobacco, THC or EtOH use.  No history of Migraine/Seizures or family history of Migraines.  Reports has first cousins with seizures.    PERTINENT  PMH / PSH:  Obesity class 1  OBJECTIVE:   BP 120/79   Pulse 76   Ht 5\' 4"  (1.626 m)   Wt 218 lb 9.6 oz (99.2 kg)   LMP 03/07/2022   SpO2 100%   BMI 37.52 kg/m    General: Alert, no acute distress Cardio: Normal S1 and S2, RRR, no r/m/g Pulm: CTAB, normal work of breathing Extremities: No lower extremity edema.  Neuro: CN II: PERRL CN III, IV,VI: EOMI CV V: Normal sensation in V1, V2, V3 CVII: Symmetric smile and brow raise CN VIII: Normal hearing CN IX,X: Symmetric palate raise  CN XI: 5/5 shoulder shrug CN XII: Symmetric tongue protrusion  UE and LE strength 5/5 2+ UE and LE reflexes  Normal sensation in UE and LE bilaterally  No ataxia with finger to nose, normal heel to shin  Negative Rhomberg    Vision Screening   Right eye Left eye Both eyes  Without correction 20/200 20/70   With correction   20/50     ASSESSMENT/PLAN:   Headache Likely tension type headache may have some component of migraine headache.  Low suspicion for CVA, lesion or ICB given no focal deficits on neuro exam.  Considered  possible seizure like activity given staring spells. Also considered metabolic and infectious etiology for progression of headaches but less likely given no systemic symptoms -Trial Sumatriptan 50 mg with onset of headache -Referral to Neurology for evaluation -TSH, CBC, CMet, HIV, RPR, A1c, Vit B12, UPT today -Follow up with PCP as needed -Strict return precautions provided  Abnormal vision Recent vision exam without any changes in glasses.  Screening today abnormal. No complaints of visual changes. Fundoscopic exam deferred.    -Recommend patient to have eyes reevaluated.       03/09/2022, MD [facility]

## 2022-03-18 NOTE — Patient Instructions (Addendum)
Thank you for coming to see me today. It was a pleasure.   We will get some labs today.  If they are abnormal or we need to do something about them, I will call you.  If they are normal, I will send you a message on MyChart (if it is active) or a letter in the mail.  If you don't hear from [place] in 2 weeks, please call the office at the number below.   Increase water intake. Ensure at least 8 hours of sleep each night Eat three meals a day.  Important not to skip meals.  Include at least 20 gm of protein with each meal.    Start Sumatriptan 50 mg with onset of headache.  Can take another 50 mg within 2 hours if not resolved.  Recommend you schedule an appointment to have your vision checked  Will refer to neurology for evaluation of staring spells.  Please follow-up with PCP in 1-2 weeks  If you have any questions or concerns, please do not hesitate to call the office at [phone].  Best,   Dana Allan, MD

## 2022-03-19 ENCOUNTER — Encounter: Payer: Self-pay | Admitting: *Deleted

## 2022-03-19 LAB — CBC WITH DIFFERENTIAL/PLATELET
Basophils Absolute: 0 10*3/uL (ref 0.0–0.2)
Basos: 1 %
EOS (ABSOLUTE): 0.3 10*3/uL (ref 0.0–0.4)
Eos: 6 %
Hematocrit: 36.8 % (ref 34.0–46.6)
Hemoglobin: 11.8 g/dL (ref 11.1–15.9)
Immature Grans (Abs): 0 10*3/uL (ref 0.0–0.1)
Immature Granulocytes: 0 %
Lymphocytes Absolute: 2.8 10*3/uL (ref 0.7–3.1)
Lymphs: 47 %
MCH: 25.7 pg — ABNORMAL LOW (ref 26.6–33.0)
MCHC: 32.1 g/dL (ref 31.5–35.7)
MCV: 80 fL (ref 79–97)
Monocytes Absolute: 0.6 10*3/uL (ref 0.1–0.9)
Monocytes: 11 %
Neutrophils Absolute: 2 10*3/uL (ref 1.4–7.0)
Neutrophils: 35 %
Platelets: 254 10*3/uL (ref 150–450)
RBC: 4.6 x10E6/uL (ref 3.77–5.28)
RDW: 13.3 % (ref 11.7–15.4)
WBC: 5.8 10*3/uL (ref 3.4–10.8)

## 2022-03-19 LAB — COMPREHENSIVE METABOLIC PANEL
ALT: 13 IU/L (ref 0–32)
AST: 12 IU/L (ref 0–40)
Albumin/Globulin Ratio: 1.6 (ref 1.2–2.2)
Albumin: 4.4 g/dL (ref 3.9–5.0)
Alkaline Phosphatase: 82 IU/L (ref 42–106)
BUN/Creatinine Ratio: 12 (ref 9–23)
BUN: 8 mg/dL (ref 6–20)
Bilirubin Total: 0.2 mg/dL (ref 0.0–1.2)
CO2: 24 mmol/L (ref 20–29)
Calcium: 9.4 mg/dL (ref 8.7–10.2)
Chloride: 104 mmol/L (ref 96–106)
Creatinine, Ser: 0.67 mg/dL (ref 0.57–1.00)
Globulin, Total: 2.7 g/dL (ref 1.5–4.5)
Glucose: 83 mg/dL (ref 70–99)
Potassium: 4 mmol/L (ref 3.5–5.2)
Sodium: 139 mmol/L (ref 134–144)
Total Protein: 7.1 g/dL (ref 6.0–8.5)
eGFR: 128 mL/min/{1.73_m2} (ref >59)

## 2022-03-19 LAB — HIV ANTIBODY (ROUTINE TESTING W REFLEX): HIV Screen 4th Generation wRfx: NONREACTIVE

## 2022-03-19 LAB — VITAMIN B12: Vitamin B-12: 638 pg/mL (ref 232–1245)

## 2022-03-19 LAB — TSH: TSH: 0.627 u[IU]/mL (ref 0.450–4.500)

## 2022-03-20 ENCOUNTER — Encounter: Payer: Self-pay | Admitting: Family Medicine

## 2022-03-20 DIAGNOSIS — R519 Headache, unspecified: Secondary | ICD-10-CM | POA: Insufficient documentation

## 2022-03-20 DIAGNOSIS — H539 Unspecified visual disturbance: Secondary | ICD-10-CM | POA: Insufficient documentation

## 2022-03-20 LAB — T PALLIDUM ANTIBODY, EIA: T pallidum Antibody, EIA: NEGATIVE

## 2022-03-20 LAB — RPR W/REFLEX TO TREPSURE: RPR: NONREACTIVE

## 2022-03-20 NOTE — Assessment & Plan Note (Signed)
Recent vision exam without any changes in glasses.  Screening today abnormal. No complaints of visual changes. Fundoscopic exam deferred.    -Recommend patient to have eyes reevaluated.

## 2022-03-20 NOTE — Assessment & Plan Note (Signed)
Likely tension type headache may have some component of migraine headache.  Low suspicion for CVA, lesion or ICB given no focal deficits on neuro exam.  Considered possible seizure like activity given staring spells. Also considered metabolic and infectious etiology for progression of headaches but less likely given no systemic symptoms -Trial Sumatriptan 50 mg with onset of headache -Referral to Neurology for evaluation -TSH, CBC, CMet, HIV, RPR, A1c, Vit B12, UPT today -Follow up with PCP as needed -Strict return precautions provided

## 2022-03-21 ENCOUNTER — Encounter: Payer: Self-pay | Admitting: Family Medicine

## 2022-03-26 ENCOUNTER — Encounter: Payer: Self-pay | Admitting: Family Medicine

## 2022-03-27 NOTE — Telephone Encounter (Signed)
She can stop the medication and schedule an appointment in clinic or go to urgent care if it's worsening.  Dana Allan, MD [facility]

## 2022-04-10 ENCOUNTER — Ambulatory Visit: Payer: Medicaid Other | Admitting: Neurology

## 2022-04-10 ENCOUNTER — Encounter: Payer: Self-pay | Admitting: Neurology

## 2022-04-10 VITALS — BP 120/83 | HR 68 | Ht 64.0 in | Wt 218.0 lb

## 2022-04-10 DIAGNOSIS — R404 Transient alteration of awareness: Secondary | ICD-10-CM

## 2022-04-10 DIAGNOSIS — G43009 Migraine without aura, not intractable, without status migrainosus: Secondary | ICD-10-CM

## 2022-04-10 MED ORDER — PROPRANOLOL HCL 20 MG PO TABS: 20.0000 mg | ORAL_TABLET | Freq: Two times a day (BID) | ORAL | 6 refills | Status: DC

## 2022-04-10 MED ORDER — SUMATRIPTAN SUCCINATE 25 MG PO TABS: 25.0000 mg | ORAL_TABLET | ORAL | 0 refills | Status: DC | PRN

## 2022-04-10 NOTE — Patient Instructions (Addendum)
Start propranolol 20 mg twice daily as headache preventative medication We will challenge patient with sumatriptan 25 mg as abortive medication MRI brain with and without contrast Routine EEG Follow up with ophthalmologist for full dilated eye exam and visual field test Follow-up in 3 months

## 2022-04-10 NOTE — Progress Notes (Signed)
[facility]  PATIENT: Tammy Jackson DOB: 2000/10/26  REQUESTING CLINICIAN: Doreene Eland, MD HISTORY FROM: Patient and mother  REASON FOR VISIT: Headaches and staring spells    HISTORICAL  CHIEF COMPLAINT:  Chief Complaint  Patient presents with   New Patient (Initial Visit)    Rm 13, with mother  NP internal referral for new onset headaches, possible seizure, Reports no new episodes, c/o blurry vision since last episode      HISTORY OF PRESENT ILLNESS:  This is a 21 year old woman past medical history of obesity, asthma, anxiety who is presenting with complaint of headaches and staring spells.  In terms of her headaches, she started the headache started last year and they are getting worse.  She describes the pain as being a frontal dull pain, sometimes start on the right side of her face.  On average she will have 4 headache days per week, headaches last a few hours or until going to sleep.  Headaches are associated with phonophobia and nausea.  At worst it is 10 out of 10.  In the past she did try magnesium oxide for the headache but she was taking it as an abortive medication.  She did also try sumatriptan 50 mg but had a bad reaction to it, she threw up after taking the medications.  She reports a family history of migraine on the mother side.  On top of that she also reports morning headaches and visual obscuration upon standing up.  Patient also reported episodes of staring spell lasting a few seconds.  She states she is aware of these episodes, afterward she might lose her train of thought but denies any frank confusion.    Headache History and Characteristics: Onset:  Last year  Location: Frontal pain  Quality:  Dull and annoying  Intensity:10 /10.  Duration: hours or until going to sleep  Migrainous Features: Phonophobia, nausea Aura: No  History of brain injury or tumor: No Family history: Mother with migraines   OTC: tylenol,  codeine Caffeine: No  Sleep: normal  Mood/ Stress: normal   Prior prophylaxis: Propranolol: No  Verapamil:No TCA: No Topamax: No Depakote: No Effexor: No Cymbalta: No Neurontin:No Magnesium sulfate: yes   Prior abortives: Triptan: Yes but cause her nausea and vomiting  Anti-emetic: No Steroids: No Ergotamine suppository: No    OTHER MEDICAL CONDITIONS: Obesity, asthma, anxiety   REVIEW OF SYSTEMS: Full 14 system review of systems performed and negative with exception of: As noted in HPI  ALLERGIES: No Known Allergies  HOME MEDICATIONS: Outpatient Medications Prior to Visit  Medication Sig Dispense Refill   albuterol (PROVENTIL HFA;VENTOLIN HFA) 108 (90 BASE) MCG/ACT inhaler Inhale 2 puffs into the lungs every 6 (six) hours as needed. For wheezing     VYFEMLA 0.4-35 MG-MCG tablet TAKE 1 TABLET BY MOUTH DAILY 28 tablet 11   SUMAtriptan (IMITREX) 50 MG tablet Take 1 tablet (50 mg total) by mouth every 2 (two) hours as needed for migraine. May repeat in 2 hours if headache persists or recurs. 10 tablet 0   famotidine (PEPCID) 20 MG tablet TAKE 2 TABLETS(40 MG) BY MOUTH TWICE DAILY 60 tablet 1   ferrous sulfate (FERROUSUL) 325 (65 FE) MG tablet Take 1 tablet (325 mg total) by mouth 2 (two) times daily. 60 tablet 1   Magnesium Oxide 400 MG CAPS Take 1 capsule (400 mg total) by mouth daily. 30 capsule 0   naproxen (NAPROSYN) 500 MG tablet Take 1 tablet (500 mg  total) by mouth 2 (two) times daily with a meal. As needed for pain 30 tablet 2   No facility-administered medications prior to visit.    PAST MEDICAL HISTORY: Past Medical History:  Diagnosis Date   Asthma     PAST SURGICAL HISTORY: No past surgical history on file.  FAMILY HISTORY: No family history on file.  SOCIAL HISTORY: Social History   Socioeconomic History   Marital status: Single    Spouse name: Not on file   Number of children: Not on file   Years of education: Not on file   Highest  education level: Not on file  Occupational History   Not on file  Tobacco Use   Smoking status: Never    Passive exposure: Yes   Smokeless tobacco: Never  Substance and Sexual Activity   Alcohol use: Never   Drug use: Never   Sexual activity: Yes    Partners: Male    Birth control/protection: None  Other Topics Concern   Not on file  Social History Narrative   Not on file   Social Determinants of Health   Financial Resource Strain: Not on file  Food Insecurity: Not on file  Transportation Needs: Not on file  Physical Activity: Not on file  Stress: Not on file  Social Connections: Not on file  Intimate Partner Violence: Not on file     PHYSICAL EXAM  GENERAL EXAM/CONSTITUTIONAL: Vitals:  Vitals:   04/10/22 1033  BP: 120/83  Pulse: 68  Weight: 218 lb (98.9 kg)  Height: 5\' 4"  (1.626 m)   Body mass index is 37.42 kg/m. Wt Readings from Last 3 Encounters:  04/10/22 218 lb (98.9 kg)  03/18/22 218 lb 9.6 oz (99.2 kg)  01/11/22 214 lb 6.4 oz (97.3 kg)   Patient is in no distress; well developed, nourished and groomed; neck is supple  EYES: Pupils round and reactive to light, Visual fields full to confrontation, Extraocular movements intacts,   MUSCULOSKELETAL: Gait, strength, tone, movements noted in Neurologic exam below  NEUROLOGIC: MENTAL STATUS:      No data to display         awake, alert, oriented to person, place and time recent and remote memory intact normal attention and concentration language fluent, comprehension intact, naming intact fund of knowledge appropriate  CRANIAL NERVE:  2nd - Fundoscopy attempted but unable to visualize fundus as patient was looking away. 2nd, 3rd, 4th, 6th - pupils equal and reactive to light, visual fields full to confrontation, extraocular muscles intact, no nystagmus 5th - facial sensation symmetric 7th - facial strength symmetric 8th - hearing intact 9th - palate elevates symmetrically, uvula  midline 11th - shoulder shrug symmetric 12th - tongue protrusion midline  MOTOR:  normal bulk and tone, full strength in the BUE, BLE  SENSORY:  normal and symmetric to light touch, pinprick, temperature, vibration  COORDINATION:  finger-nose-finger, fine finger movements normal  REFLEXES:  deep tendon reflexes present and symmetric  GAIT/STATION:  normal  DIAGNOSTIC DATA (LABS, IMAGING, TESTING) - I reviewed patient records, labs, notes, testing and imaging myself where available.  Lab Results  Component Value Date   WBC 5.8 03/18/2022   HGB 11.8 03/18/2022   HCT 36.8 03/18/2022   MCV 80 03/18/2022   PLT 254 03/18/2022      Component Value Date/Time   NA 139 03/18/2022 1209   K 4.0 03/18/2022 1209   CL 104 03/18/2022 1209   CO2 24 03/18/2022 1209   GLUCOSE 83 03/18/2022  1209   GLUCOSE 98 07/26/2019 1014   BUN 8 03/18/2022 1209   CREATININE 0.67 03/18/2022 1209   CALCIUM 9.4 03/18/2022 1209   PROT 7.1 03/18/2022 1209   ALBUMIN 4.4 03/18/2022 1209   AST 12 03/18/2022 1209   ALT 13 03/18/2022 1209   ALKPHOS 82 03/18/2022 1209   BILITOT 0.2 03/18/2022 1209   GFRNONAA NOT CALCULATED 07/26/2019 1014   GFRAA NOT CALCULATED 07/26/2019 1014   No results found for: "CHOL", "HDL", "LDLCALC", "LDLDIRECT", "TRIG", "CHOLHDL" Lab Results  Component Value Date   HGBA1C 5.4 03/18/2022   Lab Results  Component Value Date   VITAMINB12 638 03/18/2022   Lab Results  Component Value Date   TSH 0.627 03/18/2022     ASSESSMENT AND PLAN  21 y.o. year old female with medical history of obesity, asthma, anxiety who is presenting with complaint of headaches and staring spells.  For her headaches I suspect migraines but due to history of morning headache and visual obscuration, another consideration is idiopathic intracranial hypertension.  We are obtaining MRI brain which can show findings associated with of IIH.  I will also start her on propanolol as preventive medication  and sumatriptan as abortive medication but if results are more indicative of IIH we will discontinue this medication and start patient on Diamox. In terms of her staring spell, based on description, it is still unclear if these are absence seizure which would be atypical she is 21 years old (?focal seizure) but I will obtain an EEG with hyperventilation for further study.   I have also advised her to follow-up with her ophthalmologist as soon as possible and have a full dilated exam to look at the integrity of the optic nerve and also to have a full visual field test.  I will see her in 3 months for follow-up.   1. Migraine without aura and without status migrainosus, not intractable   2. Staring episodes     Patient Instructions  Start propranolol 20 mg twice daily as headache preventative medication We will challenge patient with sumatriptan 25 mg as abortive medication MRI brain with and without contrast Routine EEG Follow up with ophthalmologist for full dilated eye exam and visual field test Follow-up in 3 months   Orders Placed This Encounter  Procedures   MR BRAIN W WO CONTRAST   EEG adult    Meds ordered this encounter  Medications   propranolol (INDERAL) 20 MG tablet    Sig: Take 1 tablet (20 mg total) by mouth 2 (two) times daily.    Dispense:  60 tablet    Refill:  6   SUMAtriptan (IMITREX) 25 MG tablet    Sig: Take 1 tablet (25 mg total) by mouth as needed for migraine. May repeat in 2 hours if headache persists or recurs.    Dispense:  10 tablet    Refill:  0    Return in about 3 months (around 07/11/2022).  I have spent a total of 60 minutes dedicated to this patient today, preparing to see patient, performing a medically appropriate examination and evaluation, ordering tests and/or medications and procedures, and counseling and educating the patient/family/caregiver; independently interpreting result and communicating results to the family/patient/caregiver; and  documenting clinical information in the electronic medical record.   Windell Norfolk, MD 04/10/2022, 11:24 AM  [facility] [address], [address] [place], [place] [address] [phone]

## 2022-04-11 ENCOUNTER — Telehealth: Payer: Self-pay | Admitting: Neurology

## 2022-04-11 NOTE — Telephone Encounter (Signed)
medicaid healthy blue Berkley Harvey: [id number] exp. 04/11/22-06/09/22 sent to GI

## 2022-04-23 ENCOUNTER — Ambulatory Visit
Admission: RE | Admit: 2022-04-23 | Discharge: 2022-04-23 | Disposition: A | Discharge status: Home / Self Care | Payer: Medicaid Other | Source: Ambulatory Visit | Attending: Neurology | Admitting: Neurology

## 2022-04-23 DIAGNOSIS — G43009 Migraine without aura, not intractable, without status migrainosus: Secondary | ICD-10-CM | POA: Diagnosis not present

## 2022-04-23 MED ORDER — GADOBENATE DIMEGLUMINE 529 MG/ML IV SOLN
19.0000 mL | Freq: Once | INTRAVENOUS | Status: AC | PRN
  Administered 2022-04-23: 19 mL via INTRAVENOUS

## 2022-04-30 ENCOUNTER — Ambulatory Visit (INDEPENDENT_AMBULATORY_CARE_PROVIDER_SITE_OTHER): Payer: Medicaid Other | Admitting: Neurology

## 2022-04-30 DIAGNOSIS — R410 Disorientation, unspecified: Secondary | ICD-10-CM | POA: Diagnosis not present

## 2022-04-30 DIAGNOSIS — G43009 Migraine without aura, not intractable, without status migrainosus: Secondary | ICD-10-CM

## 2022-04-30 NOTE — Procedures (Signed)
    History:  21 year old woman with staring spells   EEG classification: Awake and drowsy  Description of the recording: The background rhythms of this recording consists of a fairly well modulated medium amplitude alpha rhythm of 10 Hz that is reactive to eye opening and closure. As the record progresses, the patient appears to remain in the waking state throughout the recording. Photic stimulation was performed, did not show any abnormalities. Hyperventilation was also performed, did not show any abnormalities. Toward the end of the recording, the patient enters the drowsy state with slight symmetric slowing seen. The patient never enters stage II sleep. No abnormal epileptiform discharges seen during this recording. There was no focal slowing. EKG monitor shows no evidence of cardiac rhythm abnormalities with a heart rate of 78.  Abnormality: None   Impression: This is a normal EEG recording in the waking and drowsy state. No evidence of interictal epileptiform discharges seen. A normal EEG does not exclude a diagnosis of epilepsy.    Windell Norfolk, MD [facility]

## 2022-05-20 ENCOUNTER — Encounter: Payer: Self-pay | Admitting: Student

## 2022-05-30 ENCOUNTER — Ambulatory Visit (INDEPENDENT_AMBULATORY_CARE_PROVIDER_SITE_OTHER): Payer: Medicaid Other | Admitting: Student

## 2022-05-30 ENCOUNTER — Other Ambulatory Visit: Payer: Self-pay

## 2022-05-30 ENCOUNTER — Encounter: Payer: Self-pay | Admitting: Student

## 2022-05-30 VITALS — BP 127/77 | HR 109 | Wt 219.0 lb

## 2022-05-30 DIAGNOSIS — N912 Amenorrhea, unspecified: Secondary | ICD-10-CM

## 2022-05-30 HISTORY — DX: Amenorrhea, unspecified: N91.2

## 2022-05-30 LAB — POCT URINE PREGNANCY: Preg Test, Ur: NEGATIVE

## 2022-05-30 NOTE — Progress Notes (Signed)
  SUBJECTIVE:   CHIEF COMPLAINT / HPI:   AUB not on birthcontrol  AUB 07/25/21 for heavy anovulatory bleeding. Patient given premarin, OCP taper, Naproxen and 2 unit prbc. OCP's recommended at f/u visit 08/19/19  Amenorrhea for 2 months Last period was in May and was normal, no period in June, but was light spotting in July. Also reporting nausea and migraines. Patient also appreciates weight gain of about 5 pounds over the last month, depsite diet and exercise. Denies any cold or heat intolerance. Denies any abnormal hair growth. Has been off of OCP since the spring time. Also appreciates acne has been poorly controlled.    PERTINENT  PMH / PSH: AUB  Past Medical History:  Diagnosis Date   Asthma     No past surgical history on file.  OBJECTIVE:  BP 127/77   Pulse (!) 109   Wt 219 lb (99.3 kg)   SpO2 99%   BMI 37.59 kg/m   General: NAD, pleasant, able to participate in exam Cardiac: RRR, no murmurs auscultated. Respiratory: CTAB, normal effort, no wheezes, rales or rhonchi Abdomen: soft, non-tender, non-distended, normoactive bowel sounds Extremities: warm and well perfused, no edema or cyanosis. Skin: warm and dry, dark velvet like rash on neck, diffuse acne present on cheeks bilaterally Psych: Normal affect and mood Physical Exam Exam conducted with a chaperone present.  Genitourinary:    General: Normal vulva.     Comments: Normal appearing vulva, no swelling or erythema. Cervix pink and normal in appearance, no swelling or lesions. Vaginal vault normal in appearance      ASSESSMENT/PLAN:  Amenorrhea Patient presents with 2 months of amenorrhea, reporting normal period prior to. Periods usually are regular. Patient not on birth control, but was last on OCP to help manage periods, stopped in the spring. Patient reports 5 lb weight gain over last month, despite diet and exercise, and also notes that her acne has been poorly controlled. Patient likely suffering from  anovulatory amenorrhea, but could also be suffering from PCOS. Will test Testosterone, prolactin, and TSH levels. IF patient had amenorrhea for 4 + months, would consider obtaining LH and FSH. Will also consider pelvic ultrasound in the future. Patient encouraged to f/u if still no period at 4 + months. Consider progestin withdrawal challenge in future. -TSH, PRL, Testosterone    Orders Placed This Encounter  Procedures   TSH Rfx on Abnormal to Free T4   Prolactin   Testosterone    Standing Status:   Future    Standing Expiration Date:   05/30/2023   POCT urine pregnancy   No orders of the defined types were placed in this encounter.  No follow-ups on file. @SIGNNOTE @

## 2022-05-30 NOTE — Patient Instructions (Addendum)
It was great to see you! Thank you for allowing me to participate in your care!   Our plans for today:  - We will look at some labs to determine if your hormones are causing this (TSH, PRL, Testosterone) - May consider pelvic ultrasound in future - Pregnancy test negative - Follow up if still no period after 1-2 months  We are checking some labs today, I will call you if they are abnormal will send you a MyChart message or a letter if they are normal.  If you do not hear about your labs in the next 2 weeks please let us know.  Take care and seek immediate care sooner if you develop any concerns.   Dr. Bess Kinds, MD [facility]

## 2022-05-30 NOTE — Assessment & Plan Note (Signed)
Patient presents with 2 months of amenorrhea, reporting normal period prior to. Periods usually are regular. Patient not on birth control, but was last on OCP to help manage periods, stopped in the spring. Patient reports 5 lb weight gain over last month, despite diet and exercise, and also notes that her acne has been poorly controlled. Patient likely suffering from anovulatory amenorrhea, but could also be suffering from PCOS. Will test Testosterone, prolactin, and TSH levels. IF patient had amenorrhea for 4 + months, would consider obtaining LH and FSH. Will also consider pelvic ultrasound in the future. Patient encouraged to f/u if still no period at 4 + months. Consider progestin withdrawal challenge in future. -TSH, PRL, Testosterone

## 2022-05-31 LAB — TESTOSTERONE: Testosterone: 40 ng/dL (ref 13–71)

## 2022-05-31 LAB — TSH RFX ON ABNORMAL TO FREE T4: TSH: 0.924 u[IU]/mL (ref 0.450–4.500)

## 2022-05-31 LAB — PROLACTIN: Prolactin: 20.4 ng/mL (ref 4.8–23.3)

## 2022-06-03 ENCOUNTER — Encounter: Payer: Self-pay | Admitting: Student

## 2022-06-04 ENCOUNTER — Telehealth: Payer: Self-pay | Admitting: Neurology

## 2022-06-04 NOTE — Telephone Encounter (Signed)
LVM and sent mychart msg asking pt to call back to reschedule 9/28 appointment - MD out 

## 2022-06-19 ENCOUNTER — Telehealth: Payer: Medicaid Other | Admitting: Physician Assistant

## 2022-06-19 ENCOUNTER — Encounter: Payer: Self-pay | Admitting: Student

## 2022-06-19 DIAGNOSIS — U071 COVID-19: Secondary | ICD-10-CM

## 2022-06-19 MED ORDER — ONDANSETRON HCL 4 MG PO TABS: 4.0000 mg | ORAL_TABLET | Freq: Three times a day (TID) | ORAL | 0 refills | Status: DC | PRN

## 2022-06-19 MED ORDER — NIRMATRELVIR/RITONAVIR (PAXLOVID)TABLET: 3.0000 | ORAL_TABLET | Freq: Two times a day (BID) | ORAL | 0 refills | Status: AC

## 2022-06-19 NOTE — Progress Notes (Signed)
Virtual Visit Consent   Tammy Jackson, you are scheduled for a virtual visit with a [facility] provider today. Just as with appointments in the office, your consent must be obtained to participate. Your consent will be active for this visit and any virtual visit you may have with one of our providers in the next 365 days. If you have a MyChart account, a copy of this consent can be sent to you electronically.  As this is a virtual visit, video technology does not allow for your provider to perform a traditional examination. This may limit your provider's ability to fully assess your condition. If your provider identifies any concerns that need to be evaluated in person or the need to arrange testing (such as labs, EKG, etc.), we will make arrangements to do so. Although advances in technology are sophisticated, we cannot ensure that it will always work on either your end or our end. If the connection with a video visit is poor, the visit may have to be switched to a telephone visit. With either a video or telephone visit, we are not always able to ensure that we have a secure connection.  By engaging in this virtual visit, you consent to the provision of healthcare and authorize for your insurance to be billed (if applicable) for the services provided during this visit. Depending on your insurance coverage, you may receive a charge related to this service.  I need to obtain your verbal consent now. Are you willing to proceed with your visit today? Tammy Jackson has provided verbal consent on 06/19/2022 for a virtual visit (video or telephone). Margaretann Loveless, PA-C  Date: 06/19/2022 4:24 PM  Virtual Visit via Video Note   I, Margaretann Loveless, connected with  Tammy Jackson  ([id number], 2000-10-23) on 06/19/22 at  4:15 PM EDT by a video-enabled telemedicine application and verified that I am speaking with the correct person using two identifiers.  Location: Patient:  Virtual Visit Location Patient: Home Provider: Virtual Visit Location Provider: Home Office   I discussed the limitations of evaluation and management by telemedicine and the availability of in person appointments. The patient expressed understanding and agreed to proceed.    History of Present Illness: Tammy Jackson is a 21 y.o. who identifies as a female who was assigned female at birth, and is being seen today for Covid 58.  HPI: URI  This is a new problem. The current episode started yesterday (Symptoms started yesterday; tested positive for covid 19 on at home test today). The problem has been gradually worsening. Maximum temperature: subjective fever. Associated symptoms include congestion, coughing (mild), headaches, nausea, sinus pain (frontal) and a sore throat (scratchy). Pertinent negatives include no diarrhea, ear pain, plugged ear sensation, rhinorrhea or vomiting. Associated symptoms comments: Chills, fatigue, post nasal drainage. She has tried NSAIDs for the symptoms. The treatment provided no relief.      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 (Take nirmatrelvir  150 mg two tablets twice daily for 5 days and ritonavir 100 mg one tablet twice daily for 5 days) Patient GFR is 128, Disp: 30 tablet, Rfl: 0   ondansetron (ZOFRAN) 4 MG tablet, Take 1 tablet (4 mg total) by mouth every 8 (eight) hours as needed for nausea or vomiting., Disp: 20 tablet, Rfl: 0    albuterol (PROVENTIL HFA;VENTOLIN HFA) 108 (90 BASE) MCG/ACT inhaler, Inhale 2 puffs into the lungs every 6 (six) hours as needed. For wheezing, Disp: , Rfl:    propranolol (INDERAL) 20 MG tablet, Take 1 tablet (20 mg total) by mouth 2 (two) times daily., Disp: 60 tablet, Rfl: 6   SUMAtriptan (IMITREX) 25 MG tablet, Take 1 tablet (25 mg total) by mouth as needed for migraine. May repeat in 2 hours if headache persists or recurs., Disp: 10 tablet, Rfl: 0   VYFEMLA 0.4-35 MG-MCG tablet, TAKE 1 TABLET BY MOUTH DAILY, Disp: 28 tablet, Rfl: 11  Observations/Objective: Patient is well-developed, well-nourished in no acute distress.  Resting comfortably at home.  Head is normocephalic, atraumatic.  No labored breathing.  Speech is clear and coherent with logical content.  Patient is alert and oriented at baseline.    Assessment and Plan: 1. COVID-19 - nirmatrelvir/ritonavir EUA (PAXLOVID) 20 x 150 MG & 10 x 100MG  TABS; Take 3 tablets by mouth 2 (two) times daily for 5 days. (Take nirmatrelvir 150 mg two tablets twice daily for 5 days and ritonavir 100 mg one tablet twice daily for 5 days) Patient GFR is 128  Dispense: 30 tablet; Refill: 0 - ondansetron (ZOFRAN) 4 MG tablet; Take 1 tablet (4 mg total) by mouth every 8 (eight) hours as needed for nausea or vomiting.  Dispense: 20 tablet; Refill: 0  - Continue OTC symptomatic management of choice - Will send OTC vitamins and supplement information through AV[facility] - Paxlovid and Zofran prescribed - Patient enrolled in MyChart symptom monitoring - Push fluids - Rest as needed - Discussed return precautions and when to seek in-person evaluation, sent via AVS as well   Follow Up Instructions: I discussed the assessment and treatment plan with the patient. The patient was provided an opportunity to ask questions and all were answered. The patient agreed with the plan and demonstrated an understanding of the instructions.  A copy of instructions were sent  to the patient via MyChart unless otherwise noted below.    The patient was advised to call back or seek an in-person evaluation if the symptoms worsen or if the condition fails to improve as anticipated.  Time:  I spent 11 minutes with the patient via telehealth technology discussing the above problems/concerns.    , PA-C

## 2022-06-19 NOTE — Patient Instructions (Signed)
Venita Lick, thank you for joining Mar Daring, PA-C for today's virtual visit.  While this provider is not your primary care provider (PCP), if your PCP is located in our provider database this encounter information will be shared with them immediately following your visit.  Consent: (Patient) Tammy Jackson provided verbal consent for this virtual visit at the beginning of the encounter.  Current Medications:  Current Outpatient Medications:    nirmatrelvir/ritonavir EUA (PAXLOVID) 20 x 150 MG & 10 x 100MG  TABS, Take 3 tablets by mouth 2 (two) times daily for 5 days. (Take nirmatrelvir 150 mg two tablets twice daily for 5 days and ritonavir 100 mg one tablet twice daily for 5 days) Patient GFR is 128, Disp: 30 tablet, Rfl: 0   ondansetron (ZOFRAN) 4 MG tablet, Take 1 tablet (4 mg total) by mouth every 8 (eight) hours as needed for nausea or vomiting., Disp: 20 tablet, Rfl: 0   albuterol (PROVENTIL HFA;VENTOLIN HFA) 108 (90 BASE) MCG/ACT inhaler, Inhale 2 puffs into the lungs every 6 (six) hours as needed. For wheezing, Disp: , Rfl:    propranolol (INDERAL) 20 MG tablet, Take 1 tablet (20 mg total) by mouth 2 (two) times daily., Disp: 60 tablet, Rfl: 6   SUMAtriptan (IMITREX) 25 MG tablet, Take 1 tablet (25 mg total) by mouth as needed for migraine. May repeat in 2 hours if headache persists or recurs., Disp: 10 tablet, Rfl: 0   VYFEMLA 0.4-35 MG-MCG tablet, TAKE 1 TABLET BY MOUTH DAILY, Disp: 28 tablet, Rfl: 11   Medications ordered in this encounter:  Meds ordered this encounter  Medications   nirmatrelvir/ritonavir EUA (PAXLOVID) 20 x 150 MG & 10 x 100MG  TABS    Sig: Take 3 tablets by mouth 2 (two) times daily for 5 days. (Take nirmatrelvir 150 mg two tablets twice daily for 5 days and ritonavir 100 mg one tablet twice daily for 5 days) Patient GFR is 128    Dispense:  30 tablet    Refill:  0    Order Specific Question:   Supervising Provider    Answer:    Sabra Heck, BRIAN [[id number]]   ondansetron (ZOFRAN) 4 MG tablet    Sig: Take 1 tablet (4 mg total) by mouth every 8 (eight) hours as needed for nausea or vomiting.    Dispense:  20 tablet    Refill:  0    Order Specific Question:   Supervising Provider    Answer:   Sabra Heck, BRIAN [[id number]]     *If you need refills on other medications prior to your next appointment, please contact your pharmacy*  Follow-Up: Call back or seek an in-person evaluation if the symptoms worsen or if the condition fails to improve as anticipated.  Other Instructions  Quarantine and Isolation Quarantine and isolation refer to local and travel restrictions to protect the public and travelers from contagious diseases that constitute a public health threat. Contagious diseases are diseases that can spread from one person to another. Quarantine and isolation help to protect the public by preventing exposure to people who have or may have a contagious disease. Isolation separates people who are sick with a contagious disease from people who are not sick. Quarantine separates and restricts the movement of people who were exposed to a contagious disease to see if they become sick. You may be put in quarantine or isolation if you have been exposed to or diagnosed with any of the following diseases: Severe acute respiratory syndromes, such as  COVID-19. Cholera. Diphtheria. Tuberculosis. Plague. Smallpox. Yellow fever. Viral hemorrhagic fevers, such as [place], Ebola, and [place]. When to quarantine or isolate Follow these rules, whether you have been vaccinated or not: Stay home and isolate from others when you are sick with a contagious disease. Isolate when you test positive for a contagious disease, even if you do not have symptoms. Isolate if you are sick and suspect that you may have a contagious disease. If you suspect that you have a contagious disease, get tested. If your test results are negative, you can end  your isolation. If your test results are positive, follow the full isolation recommendations as told by your health care provider or local health authorities. Quarantine and stay away from others when you have been in close contact with someone who has tested positive for a contagious disease. Close contact is defined as being less than 6 ft (1.8 m) away from an infected person for a total of 15 minutes or more over a 24-hour period. Do not go to places where you are unable to wear a mask, such as restaurants and some gyms. Stay home and separate from others as much as possible. Avoid being around people who may get very sick from the contagious disease that you have. Use a separate bathroom, if possible. Do not travel. For travel guidance, visit the CDC's travel webpage at http://espinoza.biz/ Follow these instructions at home: Medicines  Take over-the-counter and prescription medicines as told by your health care provider. Finish all antibiotic medicine even when you start to feel better. Stay up to date with all your vaccines. Get scheduled vaccines and boosters as recommended by your health care provider. Lifestyle Wear a high-quality mask if you must be around others at home and in public, if recommended. Improve air flow (ventilation) at home to help prevent the disease from spreading to other people, if possible. Do not share personal household items, like cups, towels, and utensils. Practice everyday hygiene and cleaning. General instructions Talk to your health care provider if you have a weakened body defense system (immune system). People with a weakened immune system may have a reduced immune response to vaccines. You may need to follow current prevention measures, including wearing a well-fitting mask, avoiding crowds, and avoiding poorly ventilated indoor places. Monitor symptoms and follow health care provider instructions, which may include resting, drinking fluids, and taking  medicines. Follow specific isolation and quarantine recommendations if you are in places that can lead to disease outbreaks, such as correctional and detention facilities, homeless shelters, and cruise ships. Return to your normal activities as told by your health care provider. Ask your health care provider what activities are safe for you. Keep all follow-up visits. This is important. Where to find more information CDC: https://martinez.com/ Contact a health care provider if: You have a fever. You have signs and symptoms that return or get worse after isolation. Get help right away if: You have difficulty breathing. You have chest pain. These symptoms may be an emergency. Get help right away. Call 911. Do not wait to see if the symptoms will go away. Do not drive yourself to the hospital. Summary Isolation and quarantine help protect the public by preventing exposure to people who have or may have a contagious disease. Isolate when you are sick or when you test positive, even if you do not have symptoms. Quarantine and stay away from others when you have been in close contact with someone who has tested positive for a contagious  disease. This information is not intended to replace advice given to you by your health care provider. Make sure you discuss any questions you have with your health care provider. Document Revised: 10/11/2021 Document Reviewed: 09/20/2021 Elsevier Patient Education  2023 Elsevier Inc.    If you have been instructed to have an in-person evaluation today at a local Urgent Care facility, please use the link below. It will take you to a list of all of our available [facility], including address, phone number and hours of operation. Please do not delay care.  [facility]  If you or a family member do not have a primary care provider, use the link below to schedule a visit and establish care. When you choose a [facility]  primary care physician or advanced practice provider, you gain a long-term partner in health. Find a Primary Care Provider  Learn more about [facility]'s in-office and virtual care options:  - Get Care Now

## 2022-06-27 ENCOUNTER — Encounter: Payer: Self-pay | Admitting: Student

## 2022-07-02 ENCOUNTER — Ambulatory Visit: Payer: Medicaid Other | Admitting: Family Medicine

## 2022-07-02 NOTE — Progress Notes (Deleted)
    SUBJECTIVE:   CHIEF COMPLAINT / HPI:   Tammy Jackson is a 21 y.o. female who presents to the [facility] clinic today to discuss the following concerns:   Asthma Concerns   PERTINENT  PMH / PSH:  None  OBJECTIVE:   There were no vitals taken for this visit. ***  General: NAD, pleasant, able to participate in exam Cardiac: RRR, no murmurs. Respiratory: CTAB, normal effort, No wheezes, rales or rhonchi Abdomen: Bowel sounds present, nontender, nondistended, no hepatosplenomegaly. Extremities: no edema or cyanosis. Skin: warm and dry, no rashes noted Neuro: alert, no obvious focal deficits Psych: Normal affect and mood  ASSESSMENT/PLAN:   No problem-specific Assessment & Plan notes found for this encounter.     Sharion Settler, [place]

## 2022-07-11 ENCOUNTER — Ambulatory Visit: Payer: Medicaid Other | Admitting: Neurology

## 2022-07-25 ENCOUNTER — Encounter: Payer: Self-pay | Admitting: Student

## 2022-07-25 DIAGNOSIS — R519 Headache, unspecified: Secondary | ICD-10-CM

## 2022-07-25 DIAGNOSIS — H539 Unspecified visual disturbance: Secondary | ICD-10-CM

## 2022-09-10 ENCOUNTER — Ambulatory Visit (INDEPENDENT_AMBULATORY_CARE_PROVIDER_SITE_OTHER): Payer: Medicaid Other | Admitting: Family Medicine

## 2022-09-10 ENCOUNTER — Encounter: Payer: Self-pay | Admitting: Family Medicine

## 2022-09-10 ENCOUNTER — Ambulatory Visit: Payer: Medicaid Other

## 2022-09-10 ENCOUNTER — Other Ambulatory Visit (HOSPITAL_COMMUNITY)
Admission: RE | Admit: 2022-09-10 | Discharge: 2022-09-10 | Disposition: A | Discharge status: Home / Self Care | Payer: Medicaid Other | Source: Ambulatory Visit | Attending: Family Medicine | Admitting: Family Medicine

## 2022-09-10 ENCOUNTER — Other Ambulatory Visit: Payer: Self-pay | Admitting: Family Medicine

## 2022-09-10 VITALS — BP 112/68 | HR 78 | Ht 64.0 in | Wt 216.8 lb

## 2022-09-10 DIAGNOSIS — B9689 Other specified bacterial agents as the cause of diseases classified elsewhere: Secondary | ICD-10-CM

## 2022-09-10 DIAGNOSIS — Z113 Encounter for screening for infections with a predominantly sexual mode of transmission: Secondary | ICD-10-CM

## 2022-09-10 DIAGNOSIS — N760 Acute vaginitis: Secondary | ICD-10-CM | POA: Diagnosis not present

## 2022-09-10 LAB — POCT WET PREP (WET MOUNT)
Clue Cells Wet Prep Whiff POC: POSITIVE
Trichomonas Wet Prep HPF POC: ABSENT
WBC, Wet Prep HPF POC: >20

## 2022-09-10 MED ORDER — ALBUTEROL SULFATE HFA 108 (90 BASE) MCG/ACT IN AERS: 2.0000 | INHALATION_SPRAY | Freq: Four times a day (QID) | RESPIRATORY_TRACT | 0 refills | Status: DC | PRN

## 2022-09-10 MED ORDER — METRONIDAZOLE 500 MG PO TABS: 500.0000 mg | ORAL_TABLET | Freq: Two times a day (BID) | ORAL | 0 refills | Status: AC

## 2022-09-10 NOTE — Progress Notes (Deleted)
  SUBJECTIVE:   CHIEF COMPLAINT / HPI:   STI check - recently became sexually active with a new partner, wants to be checked for STIs. Previously tested for ***.  - preferred gender of partner: *** - Medications tried: *** - Sexually active with *** *** partner(s) - Last sexual encounter: *** - Contraception: *** Symptoms include: {STISXs:28021}   Patient seen previously 05/30/2022 for amenorrhea with last period in May 2023 at that time.  She had discussed that she had discontinued her OCPs prior to that visit.  It was discussed during that visit that if the patient experiences amenorrhea for 4+ months, would consider obtaining an LH and FSH level    PERTINENT  PMH / PSH: H/o AUB, asthma   OBJECTIVE:  There were no vitals taken for this visit. Physical Exam   ASSESSMENT/PLAN:  There are no diagnoses linked to this encounter. No follow-ups on file. Alfredo Martinez, MD 09/10/2022, 7:14 AM  Reviewed labs and allergies, offered condoms, will check {STIplan:28022} and will call patient with results. Discussed safe sex practices. Will schedule pap if indicated ***. Patient's questions answered to their satisfaction.  PGY-2, [facility] {    This will disappear when note is signed, click to select method of visit    :1}

## 2022-09-10 NOTE — Patient Instructions (Signed)
It was wonderful to see you today.  Please bring ALL of your medications with you to every visit.   Today we talked about:  -We are checking for sexually transmitted infections including chlamydia, gonorrhea, trichomonas, HIV, syphilis and hepatitis B. I will let you know of the results via MyChart or telephone call. We are also checking for bacterial vaginosis and yeast, which are not sexually transmitted infections.  -You should abstain from sexual activity until we have the results. If your test is positive for a sexually transmitted infection, it is important that both you and your partner are both treated.  -It is always important to use barrier protection, such as condoms, to help prevent sexually transmitted infections.    Thank you for coming to your visit as scheduled. We have had a large "no-show" problem lately, and this significantly limits our ability to see and care for patients. As a friendly reminder- if you cannot make your appointment please call to cancel. We do have a no show policy for those who do not cancel within 24 hours. Our policy is that if you miss or fail to cancel an appointment within 24 hours, 3 times in a 6-month period, you may be dismissed from our clinic.   Thank you for choosing [facility].   Please call [phone] with any questions about today's appointment.  Please be sure to schedule follow up at the front  desk before you leave today.   Uzma Hellmer, DO PGY-3 Family Medicine   

## 2022-09-10 NOTE — Patient Instructions (Incomplete)
It was great to see you today! Thank you for choosing [facility] for your primary care. Tammy Jackson was seen for sti check.  Today we addressed: I will call with the results   If you haven't already, sign up for My Chart to have easy access to your labs results, and communication with your primary care physician.  We are checking some labs today. If they are abnormal, I will call you. If they are normal, I will send you a MyChart message (if it is active) or a letter in the mail. If you do not hear about your labs in the next 2 weeks, please call the office. I recommend that you always bring your medications to each appointment as this makes it easy to ensure you are on the correct medications and helps [place] not miss refills when you need them. Call the clinic at [phone] if your symptoms worsen or you have any concerns.  You should return to our clinic No follow-ups on file. Please arrive 15 minutes before your appointment to ensure smooth check in process.  We appreciate your efforts in making this happen.  Thank you for allowing me to participate in your care, Alfredo Martinez, MD 09/10/2022, 7:14 AM PGY-2, [facility]

## 2022-09-10 NOTE — Progress Notes (Signed)
    SUBJECTIVE:   CHIEF COMPLAINT / HPI:   Vaginal Discharge: Patient is a 21 y.o. female presenting with vaginal discharge for 2 days.  She states the discharge is of white, thin consistency.  She endorses slight vaginal odor.  She is interested in screening for sexually transmitted infections today.  PERTINENT  PMH / PSH: Chlamydia, trichomonas, BV   OBJECTIVE:   BP 112/68   Pulse 78   Ht 5\' 4"  (1.626 m)   Wt 216 lb 12.8 oz (98.3 kg)   LMP 08/12/2022   SpO2 99%   BMI 37.21 kg/m    General: NAD, pleasant, able to participate in exam Respiratory: Normal effort, no obvious respiratory distress Pelvic: VULVA: normal appearing vulva with no masses, tenderness or lesions, VAGINA: Normal appearing vagina with normal color, no lesions, with white and yellow discharge present CERVIX: No lesions, white and yellow discharge present,  Chaperone 08/14/2022, RN present for pelvic exam  ASSESSMENT/PLAN:    1. Routine screening for STI (sexually transmitted infection) Assessment:  21 y.o. female with vaginal discharge for 2 days.  Physical exam significant for white/yellow thin discharge. Patient is interested in STI screening.   Plan: -Contraception: on oral contraceptive pills. Discussed importance of condoms in protecting against STI. Patient given condoms in office today.  - Not due for Pap smear given age - Hepatitis B surface antigen - RPR - HIV Antibody (routine testing w rflx) - Cervicovaginal ancillary only - POCT Wet Prep [address])  2. Bacterial vaginosis Evident on wet prep. Will treat with Flagyl 500 mg BID x 7 days.   [employer], DO [place] [facility]

## 2022-09-11 LAB — CERVICOVAGINAL ANCILLARY ONLY
Chlamydia: NEGATIVE
Comment: NEGATIVE
Comment: NORMAL
Neisseria Gonorrhea: NEGATIVE

## 2022-09-11 LAB — HEPATITIS B SURFACE ANTIGEN: Hepatitis B Surface Ag: NEGATIVE

## 2022-09-11 LAB — HIV ANTIBODY (ROUTINE TESTING W REFLEX): HIV Screen 4th Generation wRfx: NONREACTIVE

## 2022-09-11 LAB — RPR: RPR Ser Ql: NONREACTIVE

## 2022-10-11 ENCOUNTER — Encounter: Payer: Self-pay | Admitting: Student

## 2022-10-20 ENCOUNTER — Other Ambulatory Visit: Payer: Self-pay

## 2022-10-20 ENCOUNTER — Ambulatory Visit
Admission: EM | Admit: 2022-10-20 | Discharge: 2022-10-20 | Disposition: A | Discharge status: Home / Self Care | Payer: Medicaid Other | Attending: Nurse Practitioner | Admitting: Nurse Practitioner

## 2022-10-20 ENCOUNTER — Encounter: Payer: Self-pay | Admitting: Emergency Medicine

## 2022-10-20 DIAGNOSIS — M62838 Other muscle spasm: Secondary | ICD-10-CM

## 2022-10-20 DIAGNOSIS — G43809 Other migraine, not intractable, without status migrainosus: Secondary | ICD-10-CM | POA: Diagnosis not present

## 2022-10-20 MED ORDER — CYCLOBENZAPRINE HCL 10 MG PO TABS: 10.0000 mg | ORAL_TABLET | Freq: Two times a day (BID) | ORAL | 0 refills | Status: DC | PRN

## 2022-10-20 MED ORDER — KETOROLAC TROMETHAMINE 60 MG/2ML IM SOLN
60.0000 mg | Freq: Once | INTRAMUSCULAR | Status: AC
  Administered 2022-10-20: 60 mg via INTRAMUSCULAR

## 2022-10-20 NOTE — ED Provider Notes (Signed)
[facility]    CSN: [id number] Arrival date & time: 10/20/22  1328      History   Chief Complaint Chief Complaint  Patient presents with   Shoulder Pain   Headache    HPI Tammy Jackson is a 22 y.o. female for evaluation of headache and shoulder pain.  Patient is accompanied by mother.  Patient has a history of migraines and has been following with neurology.  She has had an EEG as well as MRI that were normal.  She reports for the past 3 days she has had an intermittent right-sided migraine and states it is not the worst headache of her life.  Rates it as a 6 out of 10.  It is associated with nausea.  Denies any dizziness, vomiting, syncope, visual changes.  She is prescribed propranolol and Imitrex but she does not like how those make her feel and did not take any for her current symptoms.  She took Tylenol yesterday but has not taken any medications today. In addition she reports right posterior shoulder pain for the past couple of days that radiates down to her right hand.  Denies neck pain.  No injury or known inciting event.  No numbness/tingling/weakness of the right arm.  She has not taken any medications over-the-counter for symptoms.  No other concerns at this time.   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 No pertinent surgical history.  OB History     Gravida  0   Para  0   Term  0   Preterm  0   AB  0   Living  0      SAB  0   IAB  0   Ectopic  0   Multiple  0   Live Births  0            Home Medications    Prior to  Admission medications   Medication Sig Start Date End Date Taking? Authorizing Provider  cyclobenzaprine (FLEXERIL) 10 MG tablet Take 1 tablet (10 mg total) by mouth 2 (two) times daily as needed for muscle spasms. 10/20/22  Yes Radford Pax, NP  albuterol (VENTOLIN HFA) 108 (90 Base) MCG/ACT inhaler Inhale 2 puffs into the lungs every 6 (six) hours as needed. For wheezing 09/10/22   Sabino Dick, DO  propranolol (INDERAL) 20 MG tablet Take 1 tablet (20 mg total) by mouth 2 (two) times daily. 04/10/22   Windell Norfolk, MD  SUMAtriptan (IMITREX) 25 MG tablet Take 1 tablet (25 mg total) by mouth as needed for migraine. May repeat in 2 hours if headache persists or recurs. 04/10/22   Windell Norfolk, MD  VYFEMLA 0.4-35 MG-MCG tablet TAKE 1 TABLET BY MOUTH DAILY 01/03/22   Dana Allan, MD    Family History History reviewed. No pertinent family history.  Social History Social History   Tobacco Use   Smoking status: Never    Passive exposure: Yes   Smokeless tobacco: Never  Substance Use Topics   Alcohol use: Never   Drug use: Never  Allergies   Patient has no known allergies.   Review of Systems Review of Systems  Musculoskeletal:        Right shoulder pain   Neurological:  Positive for headaches.     Physical Exam Triage Vital Signs ED Triage Vitals [10/20/22 1500]  Enc Vitals Group     BP 125/84     Pulse Rate 82     Resp 18     Temp 98.9 F (37.2 C)     Temp Source Oral     SpO2 98 %     Weight      Height      Head Circumference      Peak Flow      Pain Score 8     Pain Loc      Pain Edu?      Excl. in [place]?    No data found.  Updated Vital Signs BP 125/84 (BP Location: Left Arm)   Pulse 82   Temp 98.9 F (37.2 C) (Oral)   Resp 18   SpO2 98%   Visual Acuity Right Eye Distance:   Left Eye Distance:   Bilateral Distance:    Right Eye Near:   Left Eye Near:    Bilateral Near:     Physical Exam Vitals and nursing note reviewed.   Constitutional:      Appearance: Normal appearance.  HENT:     Head: Normocephalic and atraumatic.  Eyes:     Pupils: Pupils are equal, round, and reactive to light.  Cardiovascular:     Rate and Rhythm: Normal rate.  Pulmonary:     Effort: Pulmonary effort is normal.  Musculoskeletal:     Right shoulder: Tenderness present. No swelling, deformity, effusion, laceration, bony tenderness or crepitus. Normal range of motion. Normal strength. Normal pulse.       Arms:     Comments: Strength 5 out of 5 bilateral upper extremities  Skin:    General: Skin is warm and dry.  Neurological:     General: No focal deficit present.     Mental Status: She is alert and oriented to person, place, and time.     GCS: GCS eye subscore is 4. GCS verbal subscore is 5. GCS motor subscore is 6.     Cranial Nerves: No facial asymmetry.     Motor: No weakness.     Coordination: Romberg sign negative. Coordination normal. Finger-Nose-Finger Test normal.  Psychiatric:        Mood and Affect: Mood normal.        Behavior: Behavior normal.      UC Treatments / Results  Labs (all labs ordered are listed, but only abnormal results are displayed) Labs Reviewed - No data to display  EKG   Radiology No results found.  Procedures Procedures (including critical care time)  Medications Ordered in UC Medications  ketorolac (TORADOL) injection 60 mg (60 mg Intramuscular Given 10/20/22 1532)    Initial Impression / Assessment and Plan / UC Course  I have reviewed the triage vital signs and the nursing notes.  Pertinent labs & imaging results that were available during my care of the patient were reviewed by me and considered in my medical decision making (see chart for details).     Reviewed exam and symptoms with patient and mom.  No red flags on exam. Patient given Toradol injection in clinic.  Monitored for 15 minutes after injection with no reaction noted and tolerated well.  Instructed  no  NSAIDs for 24 hours and she verbalized understanding.  May take Tylenol as needed Flexeril as needed.  Side effect profile reviewed Heat to the shoulder Advised headache diary to take to her neurologist for further treatment options for her headache. Rest Follow-up with PCP 2 to 3 days for recheck ER precautions reviewed and patient and mother verbalized understanding Final Clinical Impressions(s) / UC Diagnoses   Final diagnoses:  Other migraine without status migrainosus, not intractable  Trapezius muscle spasm     Discharge Instructions      You were given a Toradol injection in clinic today. Do not take any over the counter NSAID's such as Advil, ibuprofen, Aleve, or naproxen for 24 hours.  You may take tylenol if needed Flexeril as needed.  Please note this medication can make you drowsy.  Do not drink alcohol or drive while you are on this medication Heat to the shoulder as needed Keep a headache diary to take to neurologist for further evaluation and treatment options for migraines Please go to the emergency room if you develop any worsening symptoms    ED Prescriptions     Medication Sig Dispense Auth. Provider   cyclobenzaprine (FLEXERIL) 10 MG tablet Take 1 tablet (10 mg total) by mouth 2 (two) times daily as needed for muscle spasms. 10 tablet Radford Pax, NP      PDMP not reviewed this encounter.   Radford Pax, NP 10/20/22 [phone]

## 2022-10-20 NOTE — Discharge Instructions (Addendum)
You were given a Toradol injection in clinic today. Do not take any over the counter NSAID's such as Advil, ibuprofen, Aleve, or naproxen for 24 hours.  You may take tylenol if needed Flexeril as needed.  Please note this medication can make you drowsy.  Do not drink alcohol or drive while you are on this medication Heat to the shoulder as needed Keep a headache diary to take to neurologist for further evaluation and treatment options for migraines Please go to the emergency room if you develop any worsening symptoms

## 2022-10-20 NOTE — ED Triage Notes (Signed)
Pt here for right shoulder pain with some numbness to fingers and also right sided HA behind right eye with some blurriness x 3 days

## 2022-11-11 NOTE — Progress Notes (Unsigned)
  SUBJECTIVE:   CHIEF COMPLAINT / HPI:   Right Shoulder pain Right shoulder pain started at beginning January. It would hurt and go numb > urgent care > muscle relaxer. Last week the pain came back, she tried ibuprofen. Starting last Monday she was having nausea w/ the shoulder pain, by Wednesday the pain started spreading into shoulder blade and chest. Shoulder pain described as burning ache, like when arm fatigued from carrying groceries. Reports some arm weakness last night, when reaching for phone, but it was better this am.   Headache Also notes headaches on right side of face, that started w/ shoulder pain. Also appreciates blurriness in right side since everything started.   She also notes she was feeling cold and numb in right ear. Also appreciates she's lost her appetite.    PERTINENT  PMH / PSH:     OBJECTIVE:  BP 110/80   Pulse 94   Temp 98.5 F (36.9 C)   Ht 5' 2.21" (1.58 m)   Wt 210 lb 12.8 oz (95.6 kg)   LMP 10/28/2022 (Exact Date)   SpO2 98%   BMI 38.30 kg/m  Physical Exam Musculoskeletal:     Right shoulder: Tenderness and bony tenderness present. No swelling, deformity or crepitus. Normal range of motion. Normal strength.     Comments: Pain with arms flexed to 90 degrees and internal/external rotation. Pain with 90 degree flexion, arm at horizontal against resistance  Neurological:     Mental Status: She is alert. Mental status is at baseline.     Cranial Nerves: Cranial nerves 2-12 are intact. No cranial nerve deficit, dysarthria or facial asymmetry.     Sensory: Sensory deficit (decreased sensation in right extremity) present.     Motor: Motor function is intact. No weakness or tremor.     Gait: Gait is intact.      ASSESSMENT/PLAN:  Acute pain of right shoulder Assessment & Plan: Pt appreciates fatigue/painful sensation in right shoulder, going on for about a month. Denies any injury/trauma. Patient pain has been developing/spreading into shoulder  blade/chest. Patient has FROM, but has pain with flexion and internal/external rotation, concerning for rotator cuff injury. Patient could also be suffering from a bursitis, given location of symptoms and hx. Given symptoms, will recommend PT and close f/u. -PT -Ibuprofen 400 mg BID for pain management -F/u prn  Orders: -     Ambulatory referral to Physical Therapy  Nonintractable episodic headache, unspecified headache type Assessment & Plan: Patient notes she started having headaches w/ her shoulder pain, as well as numbness and tingling/cold sensation in right ear. Neuro exam grossly normal except for slight sensory deficit on right upper extremity. Patient has no hx of HTN, HLD, and given age, is less likely to have stroke. Also considered MS, but unlikely to have pure sensory symptoms. Patient may be having complex migraines causing these sensory symptoms. Will recommend patient keep log of symptoms and w/ close f/u. -Headache diary -F/u 1 wk    No follow-ups on file. Holley Bouche, MD 11/13/2022, 8:52 AM PGY-2, [place]

## 2022-11-11 NOTE — Patient Instructions (Signed)
It was great to see you! Thank you for allowing me to participate in your care!  It seems like you may have some shoulder irritation coming from bursitis or injury to the muscles/ligaments in the shoulder. I'd recommend you use ibuprofen as needed for pain 400 mg as needed 1-2 times a day. We will also refer you to PT to get evaluated.    Our plans for today:  - Shoulder   Pain control: Ibuprofen 400 mg once or twice a day as needed  PT referral  - Headache/Change in sensation/ Loss of appetite  We will continue to monitor for now.   Keep log of any changes  Follow up in 1 week to see if any change/improvement/decline  Seek medical care immediately if:   You have difficulty moving extremities for prolonged time   You develop facial droop or difficulties using muscles in face   You have loss of vision   You have headache that is intense and not responding to pain meds     Take care and seek immediate care sooner if you develop any concerns.   Dr. Bess Kinds, MD [facility]

## 2022-11-12 ENCOUNTER — Encounter: Payer: Self-pay | Admitting: Family Medicine

## 2022-11-12 ENCOUNTER — Ambulatory Visit (INDEPENDENT_AMBULATORY_CARE_PROVIDER_SITE_OTHER): Payer: Medicaid Other | Admitting: Student

## 2022-11-12 VITALS — BP 110/80 | HR 94 | Temp 98.5°F | Ht 62.21 in | Wt 210.8 lb

## 2022-11-12 DIAGNOSIS — M25511 Pain in right shoulder: Secondary | ICD-10-CM | POA: Diagnosis not present

## 2022-11-12 DIAGNOSIS — R519 Headache, unspecified: Secondary | ICD-10-CM

## 2022-11-13 DIAGNOSIS — M25511 Pain in right shoulder: Secondary | ICD-10-CM | POA: Insufficient documentation

## 2022-11-13 NOTE — Assessment & Plan Note (Signed)
Pt appreciates fatigue/painful sensation in right shoulder, going on for about a month. Denies any injury/trauma. Patient pain has been developing/spreading into shoulder blade/chest. Patient has FROM, but has pain with flexion and internal/external rotation, concerning for rotator cuff injury. Patient could also be suffering from a bursitis, given location of symptoms and hx. Given symptoms, will recommend PT and close f/u. -PT -Ibuprofen 400 mg BID for pain management -F/u prn

## 2022-11-13 NOTE — Assessment & Plan Note (Signed)
Patient notes she started having headaches w/ her shoulder pain, as well as numbness and tingling/cold sensation in right ear. Neuro exam grossly normal except for slight sensory deficit on right upper extremity. Patient has no hx of HTN, HLD, and given age, is less likely to have stroke. Also considered MS, but unlikely to have pure sensory symptoms. Patient may be having complex migraines causing these sensory symptoms. Will recommend patient keep log of symptoms and w/ close f/u. -Headache diary -F/u 1 wk

## 2022-11-20 ENCOUNTER — Ambulatory Visit: Payer: Medicaid Other | Admitting: Family Medicine

## 2022-11-20 NOTE — Progress Notes (Deleted)
    SUBJECTIVE:   CHIEF COMPLAINT / HPI:   Saw Dr Marcina Millard on 11/12/22 Acute pain of right shoulder Assessment & Plan: Pt appreciates fatigue/painful sensation in right shoulder, going on for about a month. Denies any injury/trauma. Patient pain has been developing/spreading into shoulder blade/chest. Patient has FROM, but has pain with flexion and internal/external rotation, concerning for rotator cuff injury. Patient could also be suffering from a bursitis, given location of symptoms and hx. Given symptoms, will recommend PT and close f/u. -PT -Ibuprofen 400 mg BID for pain management -F/u prn   Orders: -     Ambulatory referral to Physical Therapy   Nonintractable episodic headache, unspecified headache type Assessment & Plan: Patient notes she started having headaches w/ her shoulder pain, as well as numbness and tingling/cold sensation in right ear. Neuro exam grossly normal except for slight sensory deficit on right upper extremity. Patient has no hx of HTN, HLD, and given age, is less likely to have stroke. Also considered MS, but unlikely to have pure sensory symptoms. Patient may be having complex migraines causing these sensory symptoms. Will recommend patient keep log of symptoms and w/ close f/u. -Headache diary -F/u 1 wk    PERTINENT  PMH / PSH: *** Patient Active Problem List   Diagnosis Date Noted   Acute pain of right shoulder 11/13/2022   Headache 03/20/2022   Abnormal vision 03/20/2022   Routine screening for STI (sexually transmitted infection) 03/10/2021   Bilateral knee pain 05/12/2020    Current Outpatient Medications  Medication Instructions   albuterol (VENTOLIN HFA) 108 (90 Base) MCG/ACT inhaler 2 puffs, Inhalation, Every 6 hours PRN, For wheezing    cyclobenzaprine (FLEXERIL) 10 mg, Oral, 2 times daily PRN   propranolol (INDERAL) 20 mg, Oral, 2 times daily   SUMAtriptan (IMITREX) 25 mg, Oral, As needed, May repeat in 2 hours if headache persists or  recurs.   VYFEMLA 0.4-35 MG-MCG tablet TAKE 1 TABLET BY MOUTH DAILY       11/12/2022    8:47 AM 10/20/2022    3:00 PM 09/10/2022   11:48 AM  Vitals with BMI  Height 5' 2.205"  5\' 4"   Weight 210 lbs 13 oz  216 lbs 13 oz  BMI 16.1  09.6  Systolic 045 409 811  Diastolic 80 84 68  Pulse 94 82 78      OBJECTIVE:   LMP 10/28/2022 (Exact Date)   ***  ASSESSMENT/PLAN:   There are no diagnoses linked to this encounter.   There are no Patient Instructions on file for this visit.   Lind Covert, MD [place]

## 2022-11-28 ENCOUNTER — Encounter: Payer: Self-pay | Admitting: Family Medicine

## 2022-11-28 ENCOUNTER — Other Ambulatory Visit: Payer: Self-pay | Admitting: Family Medicine

## 2022-11-28 ENCOUNTER — Ambulatory Visit (INDEPENDENT_AMBULATORY_CARE_PROVIDER_SITE_OTHER): Payer: Medicaid Other | Admitting: Family Medicine

## 2022-11-28 ENCOUNTER — Other Ambulatory Visit (HOSPITAL_COMMUNITY)
Admission: RE | Admit: 2022-11-28 | Discharge: 2022-11-28 | Disposition: A | Discharge status: Home / Self Care | Payer: Medicaid Other | Source: Ambulatory Visit | Attending: Family Medicine | Admitting: Family Medicine

## 2022-11-28 VITALS — BP 114/77 | HR 100 | Ht 62.0 in | Wt 212.1 lb

## 2022-11-28 DIAGNOSIS — N898 Other specified noninflammatory disorders of vagina: Secondary | ICD-10-CM | POA: Diagnosis not present

## 2022-11-28 DIAGNOSIS — B3731 Acute candidiasis of vulva and vagina: Secondary | ICD-10-CM

## 2022-11-28 LAB — POCT WET PREP (WET MOUNT)
Clue Cells Wet Prep Whiff POC: NEGATIVE
Trichomonas Wet Prep HPF POC: ABSENT

## 2022-11-28 MED ORDER — FLUCONAZOLE 150 MG PO TABS: 150.0000 mg | ORAL_TABLET | Freq: Once | ORAL | 0 refills | Status: AC

## 2022-11-28 NOTE — Progress Notes (Signed)
    SUBJECTIVE:   CHIEF COMPLAINT / HPI:   Patient presents with vaginal discharge, appears white. Started 3 days ago, seems to be improving since the first day. Denies fever, chills, abdominal pain, pelvic pain and dysuria. Also reports some vaginal irritation. Denies any new partners. LMP 10/28/2022, cycles are typical without recent changes. She is off birth control and would like to stay off for now. Uses barrier protection although she is sexually active.   OBJECTIVE:   BP 114/77   Pulse 100   Ht 5\' 2"  (1.575 m)   Wt 212 lb 2 oz (96.2 kg)   LMP 10/28/2022 (Exact Date)   SpO2 100%   BMI 38.80 kg/m   General: Patient well-appearing, in no acute distress. GU: normal labia, no rashes or external lesions noted, white cervical discharge noted, normal vagina, no associated odor   GU exam performed in the presence of Tashira Legette, CMA.   ASSESSMENT/PLAN:   Vaginal discharge -wet prep pending -GC/Chlamydia obtained per patient preference and pending -patient politely declined HIV and RPR testing -encouraged restarting contraception, patient will return for contraception counseling when she is ready -safe sex counseling provided -follow up as appropriate   -PHQ-9 score of 0 reviewed.   Donney Dice, [place]

## 2022-11-28 NOTE — Patient Instructions (Signed)
It was great seeing you today!  Today we discussed your symptoms, I will let you know of the results of the testing. If they are abnormal, I will call you and if not you will receive a message on mychart. Please make sure to continue to use protection and let [place] know when you decide to restart birth control.   Please follow up at your next scheduled appointment, if anything arises between now and then, please don't hesitate to contact our office.   Thank you for allowing [place] to be a part of your medical care!  Thank you, Dr. Larae Grooms  Also a reminder of our clinic's no-show policy. Please make sure to arrive at least 15 minutes prior to your scheduled appointment time. Please try to cancel before 24 hours if you are not able to make it. If you no-show for 2 appointments then you will be receiving a warning letter. If you no-show after 3 visits, then you may be at risk of being dismissed from our clinic. This is to ensure that everyone is able to be seen in a timely manner. Thank you, we appreciate your assistance with this!

## 2022-11-28 NOTE — Assessment & Plan Note (Signed)
-  wet prep pending -GC/Chlamydia obtained per patient preference and pending -patient politely declined HIV and RPR testing -encouraged restarting contraception, patient will return for contraception counseling when she is ready -safe sex counseling provided -follow up as appropriate

## 2022-11-29 LAB — CERVICOVAGINAL ANCILLARY ONLY
Bacterial Vaginitis (gardnerella): NEGATIVE
Candida Glabrata: NEGATIVE
Candida Vaginitis: POSITIVE — AB
Chlamydia: NEGATIVE
Comment: NEGATIVE
Comment: NEGATIVE
Comment: NEGATIVE
Comment: NEGATIVE
Comment: NEGATIVE
Comment: NORMAL
Neisseria Gonorrhea: NEGATIVE
Trichomonas: NEGATIVE

## 2022-12-01 ENCOUNTER — Encounter: Payer: Self-pay | Admitting: Student

## 2022-12-24 ENCOUNTER — Encounter: Payer: Self-pay | Admitting: Student

## 2022-12-30 ENCOUNTER — Ambulatory Visit: Payer: Medicaid Other | Admitting: Family Medicine

## 2022-12-30 NOTE — Progress Notes (Deleted)
    SUBJECTIVE:   CHIEF COMPLAINT / HPI:  No chief complaint on file.   ***  PERTINENT  PMH / PSH: ***  Patient Care Team: Holley Bouche, MD as PCP - General (Family Medicine)   OBJECTIVE:   There were no vitals taken for this visit.  Physical Exam      11/28/2022    9:24 AM  Depression screen PHQ 2/9  Decreased Interest 0  Down, Depressed, Hopeless 0  PHQ - 2 Score 0  Altered sleeping 0  Tired, decreased energy 0  Change in appetite 0  Feeling bad or failure about yourself  0  Trouble concentrating 0  Moving slowly or fidgety/restless 0  Suicidal thoughts 0  PHQ-9 Score 0     {Show previous vital signs (optional):23777}  {Labs  Heme  Chem  Endocrine  Serology  Results Review (optional):23779}  ASSESSMENT/PLAN:   No problem-specific Assessment & Plan notes found for this encounter.    No follow-ups on file.   Tammy Button, MD [place]

## 2023-01-07 ENCOUNTER — Encounter: Payer: Self-pay | Admitting: Family Medicine

## 2023-01-07 ENCOUNTER — Ambulatory Visit (INDEPENDENT_AMBULATORY_CARE_PROVIDER_SITE_OTHER): Payer: Medicaid Other | Admitting: Family Medicine

## 2023-01-07 ENCOUNTER — Other Ambulatory Visit (HOSPITAL_COMMUNITY)
Admission: RE | Admit: 2023-01-07 | Discharge: 2023-01-07 | Disposition: A | Discharge status: Home / Self Care | Payer: Medicaid Other | Source: Ambulatory Visit | Attending: Family Medicine | Admitting: Family Medicine

## 2023-01-07 VITALS — BP 125/73 | HR 99 | Ht 61.0 in | Wt 213.5 lb

## 2023-01-07 DIAGNOSIS — N898 Other specified noninflammatory disorders of vagina: Secondary | ICD-10-CM

## 2023-01-07 DIAGNOSIS — Z124 Encounter for screening for malignant neoplasm of cervix: Secondary | ICD-10-CM | POA: Insufficient documentation

## 2023-01-07 LAB — POCT WET PREP (WET MOUNT)
Clue Cells Wet Prep Whiff POC: NEGATIVE
Trichomonas Wet Prep HPF POC: ABSENT

## 2023-01-07 NOTE — Patient Instructions (Addendum)
It was great seeing you today!  Today we discussed your vaginal discharge and we also performed a PAP smear for cervical cancer screening, I will inform you of the results when they return.   Please follow up at your next scheduled appointment on 4/5 at 9:10 am, if anything arises between now and then, please don't hesitate to contact our office.   Thank you for allowing [place] to be a part of your medical care!  Thank you, Dr. Larae Grooms  Also a reminder of our clinic's no-show policy. Please make sure to arrive at least 15 minutes prior to your scheduled appointment time. Please try to cancel before 24 hours if you are not able to make it. If you no-show for 2 appointments then you will be receiving a warning letter. If you no-show after 3 visits, then you may be at risk of being dismissed from our clinic. This is to ensure that everyone is able to be seen in a timely manner. Thank you, we appreciate your assistance with this!

## 2023-01-07 NOTE — Assessment & Plan Note (Signed)
-  completed first PAP smear today, will follow up on results and notify patient when available

## 2023-01-07 NOTE — Assessment & Plan Note (Addendum)
-  pending wet prep -recently had GC/Chlamydia testing and was negative -counseling on safe sex practices

## 2023-01-07 NOTE — Progress Notes (Signed)
    SUBJECTIVE:   CHIEF COMPLAINT / HPI:   Patient presents for concern of white, thick vaginal discharge. Denies any fever, chills, nausea, vomiting, abdominal or pelvic pain. Denies any new partners and continues to be sexually active. She has not yet had a PAP smear and is in agreement to have this done today as well. LMP 01/03/2023. Denies any other concerns at this time.   OBJECTIVE:   BP 125/73   Pulse 99   Ht 5\' 1"  (1.549 m)   Wt 213 lb 8 oz (96.8 kg)   LMP 12/04/2022   SpO2 100%   BMI 40.34 kg/m   General: Patient well-appearing, in no acute distress. Resp: normal work of breathing noted GU: normal labia, normal vagina and cervix, no adnexal masses or tenderness noted, no polyps, no associated foul odor   GU exam performed in the presence of chaperone, [employer], CMA.   ASSESSMENT/PLAN:   Vaginal discharge -pending wet prep -recently had GC/Chlamydia testing and was negative -counseling on safe sex practices   Cervical cancer screening -completed first PAP smear today, will follow up on results and notify patient when available     -PHQ-9 score of 0.  -Scheduled PCP follow up on 4/5.   Donney Dice, [place]

## 2023-01-09 ENCOUNTER — Encounter: Payer: Self-pay | Admitting: Student

## 2023-01-10 LAB — CYTOLOGY - PAP
Comment: NEGATIVE
Diagnosis: UNDETERMINED — AB
High risk HPV: NEGATIVE

## 2023-01-17 ENCOUNTER — Ambulatory Visit (INDEPENDENT_AMBULATORY_CARE_PROVIDER_SITE_OTHER): Payer: Medicaid Other | Admitting: Student

## 2023-01-17 VITALS — BP 117/71 | HR 99 | Ht 61.0 in | Wt 212.4 lb

## 2023-01-17 DIAGNOSIS — F419 Anxiety disorder, unspecified: Secondary | ICD-10-CM | POA: Diagnosis not present

## 2023-01-17 MED ORDER — HYDROXYZINE HCL 10 MG PO TABS: 10.0000 mg | ORAL_TABLET | Freq: Three times a day (TID) | ORAL | 0 refills | Status: DC | PRN

## 2023-01-17 MED ORDER — SERTRALINE HCL 50 MG PO TABS: 50.0000 mg | ORAL_TABLET | Freq: Every day | ORAL | 1 refills | Status: DC

## 2023-01-17 NOTE — Patient Instructions (Signed)
It was great to see you! Thank you for allowing me to participate in your care!  I recommend that you always bring your medications to each appointment as this makes it easy to ensure we are on the correct medications and helps [place] not miss when refills are needed.  Our plans for today:  - Anxiety  Zoloft 50 mg daily  Atarax 10 mg up to 3 times a day as needed   Don't drive or use machinery when taking medication, it is sedating.   Follow up appointment in 2 weeks   Can discuss changing dose. If having significant difficulty with anxiety, message me sooner and we'll adjust meds  Take care and seek immediate care sooner if you develop any concerns.   Dr. Bess Kinds, MD [facility]

## 2023-01-17 NOTE — Progress Notes (Unsigned)
  SUBJECTIVE:   CHIEF COMPLAINT / HPI:   Anxiety and nausea  Has been an issue since she was 13. Happens when she's in public, also has anxiety when leaving her partner/family. Has tried tapping and other techniques to help deal with anxiety. Anxiety attacks look like shakiness and fidgety, or staring spells. Distraction helps, talking to other people.   Notes mood is good, not feeling depressed.    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 No follow-ups on file. Bess Kinds, MD 01/17/2023, 7:31 AM PGY-***, [facility] {    This will disappear when note is signed, click to select method of visit    :1}

## 2023-01-21 DIAGNOSIS — F419 Anxiety disorder, unspecified: Secondary | ICD-10-CM | POA: Insufficient documentation

## 2023-01-21 NOTE — Assessment & Plan Note (Signed)
Patient notes anxiety for years related to social situations, separation from family members and partners. Notes at times she has periods of heighten anxiety where she get's shakey/fidgety/stares off. She scores high on GAD 7 with symptoms happening multiple times a week in almost all categories. Her mood is otherwise good and she does not feel depressed. Patient would benefit from therapy and antidepressant.  -Zoloft 50 mg -Atarax 10 mg TID prm -F/u 2 wk

## 2023-01-31 ENCOUNTER — Ambulatory Visit: Payer: Medicaid Other | Admitting: Student

## 2023-01-31 NOTE — Progress Notes (Deleted)
  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 No follow-ups on file. Bess Kinds, MD 01/31/2023, 7:52 AM PGY-***, [facility] {    This will disappear when note is signed, click to select method of visit    :1}

## 2023-02-05 ENCOUNTER — Other Ambulatory Visit: Payer: Self-pay | Admitting: Student

## 2023-02-05 ENCOUNTER — Encounter: Payer: Self-pay | Admitting: Student

## 2023-02-05 MED ORDER — VYFEMLA 0.4-35 MG-MCG PO TABS: 1.0000 | ORAL_TABLET | Freq: Every day | ORAL | 10 refills | Status: DC

## 2023-02-05 NOTE — Progress Notes (Signed)
Patient requesting refill of birth control

## 2023-02-20 ENCOUNTER — Ambulatory Visit (INDEPENDENT_AMBULATORY_CARE_PROVIDER_SITE_OTHER): Payer: Medicaid Other | Admitting: Student

## 2023-02-20 ENCOUNTER — Encounter: Payer: Self-pay | Admitting: Student

## 2023-02-20 ENCOUNTER — Other Ambulatory Visit (HOSPITAL_COMMUNITY)
Admission: RE | Admit: 2023-02-20 | Discharge: 2023-02-20 | Disposition: A | Discharge status: Home / Self Care | Payer: Medicaid Other | Source: Ambulatory Visit | Attending: Family Medicine | Admitting: Family Medicine

## 2023-02-20 VITALS — BP 110/70 | HR 76 | Ht 61.0 in | Wt 217.8 lb

## 2023-02-20 DIAGNOSIS — Z113 Encounter for screening for infections with a predominantly sexual mode of transmission: Secondary | ICD-10-CM | POA: Insufficient documentation

## 2023-02-20 DIAGNOSIS — J45909 Unspecified asthma, uncomplicated: Secondary | ICD-10-CM | POA: Diagnosis not present

## 2023-02-20 LAB — POCT WET PREP (WET MOUNT)
Clue Cells Wet Prep Whiff POC: POSITIVE
Trichomonas Wet Prep HPF POC: ABSENT

## 2023-02-20 MED ORDER — ALBUTEROL SULFATE HFA 108 (90 BASE) MCG/ACT IN AERS: 2.0000 | INHALATION_SPRAY | Freq: Four times a day (QID) | RESPIRATORY_TRACT | 0 refills | Status: DC | PRN

## 2023-02-20 NOTE — Progress Notes (Signed)
  SUBJECTIVE:   CHIEF COMPLAINT / HPI:   STI testing Patient has new partner and is wanting screening for STI's. She denies any symptoms (burning, itching, discharge, fever, abdominal pain).   PERTINENT  PMH / PSH: Anxiety    Patient Care Team: Bess Kinds, MD as PCP - General (Family Medicine) OBJECTIVE:  BP 110/70   Pulse 76   Ht 5\' 1"  (1.549 m)   Wt 217 lb 12.8 oz (98.8 kg)   LMP 02/07/2023   SpO2 99%   BMI 41.15 kg/m  Physical Exam Exam conducted with a chaperone present.  Genitourinary:    General: Normal vulva.     Vagina: No signs of injury. Vaginal discharge present. No erythema, tenderness, bleeding or lesions.     Cervix: No cervical motion tenderness or discharge.      ASSESSMENT/PLAN:  Screening for STD (sexually transmitted disease) -     Cervicovaginal ancillary only -     POCT Wet Prep [facility]) -     HIV Antibody (routine testing w rflx) -     RPR w/reflex to TrepSure  Routine screening for STI (sexually transmitted infection) Assessment & Plan: Patient wanting screening for STI's as she has new partner and had unprotected sex recently. Denies any symptoms of burning, itching, discharge, or fevers/abdominal pain.  -STI screen (GC/CT/ Trich, Yeast, BV, RPR, HIV)   Uncomplicated asthma, unspecified asthma severity, unspecified whether persistent Assessment & Plan: Patient requesting refill of albuterol and notes asthma has been worse recently. Asked patient to come in for assessment.  -F/u asthma prn -albuterol prn   Other orders -     Albuterol Sulfate HFA; Inhale 2 puffs into the lungs every 6 (six) hours as needed. For wheezing  Dispense: 6.7 g; Refill: 0 -     T pallidum Antibody, EIA   No follow-ups on file. Bess Kinds, MD 02/22/2023, 6:44 PM PGY-2, [facility]

## 2023-02-20 NOTE — Patient Instructions (Signed)
It was great to see you! Thank you for allowing me to participate in your care!  Our plans for today:  - STI testing  HIV/Syphilis/Gonorrhea/Chlamydia/Trichomonas/Yeast/BV  We are checking some labs today, I will call you if they are abnormal will send you a MyChart message or a letter if they are normal.  If you do not hear about your labs in the next 2 weeks please let us know.  Take care and seek immediate care sooner if you develop any concerns.   Dr. Bess Kinds, MD [facility]

## 2023-02-21 LAB — RPR W/REFLEX TO TREPSURE: RPR: NONREACTIVE

## 2023-02-21 LAB — CERVICOVAGINAL ANCILLARY ONLY
Chlamydia: POSITIVE — AB
Comment: NEGATIVE
Comment: NORMAL
Neisseria Gonorrhea: NEGATIVE

## 2023-02-21 LAB — HIV ANTIBODY (ROUTINE TESTING W REFLEX): HIV Screen 4th Generation wRfx: NONREACTIVE

## 2023-02-21 LAB — T PALLIDUM ANTIBODY, EIA: T pallidum Antibody, EIA: NEGATIVE

## 2023-02-22 ENCOUNTER — Encounter: Payer: Self-pay | Admitting: Student

## 2023-02-22 ENCOUNTER — Telehealth: Payer: Self-pay | Admitting: Student

## 2023-02-22 DIAGNOSIS — J45909 Unspecified asthma, uncomplicated: Secondary | ICD-10-CM | POA: Insufficient documentation

## 2023-02-22 MED ORDER — METRONIDAZOLE 500 MG PO TABS: 500.0000 mg | ORAL_TABLET | Freq: Two times a day (BID) | ORAL | 0 refills | Status: AC

## 2023-02-22 MED ORDER — AZITHROMYCIN 500 MG PO TABS: 1000.0000 mg | ORAL_TABLET | Freq: Once | ORAL | 0 refills | Status: AC

## 2023-02-22 NOTE — Telephone Encounter (Signed)
Called to inform patient that she was positive for Bacterial Vaginosis and Chlamydia. These will both need to be treated. Her partner should be treated for Chlamydia and she should abstain from sex for 3 wks.   Will message patient over myChart and send in abx  Metronidazole 500 mg BID x 7 days for BV  Azithromycin 1000 mg x1 for Chlamydia

## 2023-02-22 NOTE — Assessment & Plan Note (Signed)
Patient requesting refill of albuterol and notes asthma has been worse recently. Asked patient to come in for assessment.  -F/u asthma prn -albuterol prn

## 2023-02-22 NOTE — Assessment & Plan Note (Signed)
Patient wanting screening for STI's as she has new partner and had unprotected sex recently. Denies any symptoms of burning, itching, discharge, or fevers/abdominal pain.  -STI screen (GC/CT/ Trich, Yeast, BV, RPR, HIV)

## 2023-04-12 ENCOUNTER — Encounter: Payer: Self-pay | Admitting: Student

## 2023-04-12 DIAGNOSIS — Z3169 Encounter for other general counseling and advice on procreation: Secondary | ICD-10-CM

## 2023-05-20 ENCOUNTER — Other Ambulatory Visit (HOSPITAL_COMMUNITY)
Admission: RE | Admit: 2023-05-20 | Discharge: 2023-05-20 | Disposition: A | Discharge status: Home / Self Care | Payer: Medicaid Other | Source: Ambulatory Visit | Attending: Family Medicine | Admitting: Family Medicine

## 2023-05-20 ENCOUNTER — Ambulatory Visit (INDEPENDENT_AMBULATORY_CARE_PROVIDER_SITE_OTHER): Payer: Medicaid Other | Admitting: Student

## 2023-05-20 ENCOUNTER — Other Ambulatory Visit: Payer: Self-pay

## 2023-05-20 VITALS — BP 112/63 | HR 86 | Ht 61.0 in | Wt 224.0 lb

## 2023-05-20 DIAGNOSIS — Z7251 High risk heterosexual behavior: Secondary | ICD-10-CM

## 2023-05-20 DIAGNOSIS — Z8619 Personal history of other infectious and parasitic diseases: Secondary | ICD-10-CM | POA: Diagnosis not present

## 2023-05-20 DIAGNOSIS — N912 Amenorrhea, unspecified: Secondary | ICD-10-CM

## 2023-05-20 LAB — POCT URINE PREGNANCY: Preg Test, Ur: NEGATIVE

## 2023-05-20 MED ORDER — PRENATAL VITAMIN AND MINERAL 28-0.8 MG PO TABS
1.0000 | ORAL_TABLET | Freq: Every day | ORAL | 3 refills | Status: DC
Start: 2023-05-20 — End: 2024-08-31

## 2023-05-20 NOTE — Progress Notes (Unsigned)
    SUBJECTIVE:   CHIEF COMPLAINT / HPI:   Amenorrhea Last period was June 26. Typically has regular periods. Is sexually active and open to pregnancy. Previously taking PNV but stopped due to moving her meds and misplacing them. Requests more.  Of note, had Chlamydia 3 mo ago. Treated. Has not been re-tested.    OBJECTIVE:   BP 112/63   Pulse 86   Ht 5\' 1"  (1.549 m)   Wt 224 lb (101.6 kg)   SpO2 100%   BMI 42.32 kg/m   Physical Exam Exam conducted with a chaperone present.  Constitutional:      General: She is not in acute distress. HENT:     Mouth/Throat:     Mouth: Mucous membranes are moist.  Pulmonary:     Effort: Pulmonary effort is normal.  Genitourinary:    General: Normal vulva.     Comments: Sample obtained via blind swab, no speculum exam performed today.  Neurological:     General: No focal deficit present.  Psychiatric:        Mood and Affect: Mood normal.      ASSESSMENT/PLAN:   Amenorrhea Amenorrheic for about 6 weeks at this point.  Pregnancy test negative today.  She is open to pregnancy and not using anything for contraception.  Discussed restarting prenatal vitamins at this time. Would consider secondary amenorrhea workup if she reaches the three month mark. -Follow-up if remains amenorrheic at the 35-month mark in September OR if develops other signs/symptoms of pregnancy -She continues to pursue pregnancy, will return for pregnancy test as needed -Daily prenatal vitamin   History of Chlamydia 3 mo ago. Treated. Asymptomatic. - Retest GC/Chlamydia today.  Eliezer Mccoy, MD [facility]

## 2023-05-20 NOTE — Patient Instructions (Signed)
You are not pregnant today. We are repeating your vaginal swabs to make sure you've cleared any infection. I will MyChart message you if these are normal and will plan to call you if there's anything abnormal or any changes we need to make.  Since you are open to pregnancy, I recommend that you restart your prenatal vitamins and I will order those today.  If you've still not had a period come September, come back to see [place].   Eliezer Mccoy, MD

## 2023-05-20 NOTE — Progress Notes (Deleted)
No period since since June 24th. Regular prior to that.

## 2023-05-20 NOTE — Assessment & Plan Note (Signed)
Amenorrheic for about 6 weeks at this point.  Pregnancy test negative today.  She is open to pregnancy and not using anything for contraception.  Discussed restarting prenatal vitamins at this time. -Follow-up if remains amenorrheic at the 51-month mark in September -She continues to pursue pregnancy, will return for pregnancy test as needed -Daily prenatal vitamin

## 2023-05-22 ENCOUNTER — Encounter: Payer: Self-pay | Admitting: Student

## 2023-05-22 ENCOUNTER — Other Ambulatory Visit: Payer: Self-pay | Admitting: Student

## 2023-05-22 MED ORDER — HYDROXYZINE HCL 10 MG PO TABS: 10.0000 mg | ORAL_TABLET | Freq: Three times a day (TID) | ORAL | 0 refills | Status: DC | PRN

## 2023-05-22 NOTE — Telephone Encounter (Signed)
Called patient to discuss. She is doing okay. Generally fatigued but no respiratory symptoms or fever at present. Discussed supportive care. Anticipate self-resolving course. Advised return-to-care precautions for difficulty breathing.

## 2023-06-01 ENCOUNTER — Encounter: Payer: Self-pay | Admitting: Student

## 2023-07-20 ENCOUNTER — Other Ambulatory Visit: Payer: Self-pay | Admitting: Student

## 2023-08-08 ENCOUNTER — Encounter: Payer: Self-pay | Admitting: Student

## 2023-08-21 ENCOUNTER — Encounter: Payer: Self-pay | Admitting: Student

## 2023-08-22 ENCOUNTER — Encounter: Payer: Self-pay | Admitting: Student

## 2023-08-22 ENCOUNTER — Ambulatory Visit (INDEPENDENT_AMBULATORY_CARE_PROVIDER_SITE_OTHER): Payer: Medicaid Other | Admitting: Student

## 2023-08-22 VITALS — BP 107/70 | HR 87 | Ht 61.0 in | Wt 232.2 lb

## 2023-08-22 DIAGNOSIS — M7989 Other specified soft tissue disorders: Secondary | ICD-10-CM | POA: Insufficient documentation

## 2023-08-22 LAB — POCT URINE PREGNANCY: Preg Test, Ur: NEGATIVE

## 2023-08-22 NOTE — Progress Notes (Signed)
  SUBJECTIVE:   CHIEF COMPLAINT / HPI:   Bilat Leg Pain Patient appreciates achy leg pain that is been going on for a little over a week.  No injury or trauma.  Located on shins, also appreciates indentation in legs when pressing on them.  No fevers, chills, or systemic symptoms.  Patient is sexually active, not on birth control.  PERTINENT  PMH / PSH:   OBJECTIVE:  There were no vitals taken for this visit. Physical Exam Musculoskeletal:     Right lower leg: Tenderness present. No deformity, lacerations or bony tenderness. 1+ Pitting Edema present.     Left lower leg: Tenderness present. No bony tenderness. 1+ Pitting Edema present.      ASSESSMENT/PLAN:   Assessment & Plan Leg swelling Patient appreciates leg swelling bilaterally, going on for little over a week.  Patient denies any injury or trauma.  Patient also appreciates tenderness in anterior shin, feels like dull ache.  Patient in normal state of health, with nearly 1+ pitting edema located on anterior shins.  Unsure what could be causing this, will test for pregnancy, versus electrolyte abnormality, renal dysfunction, anemia.  Symptoms could also be secondary to some fluid retention as well.  Low concern for cardiac etiology, or DVT, at this time.  Patient denies any change in activity, and walks regularly. - BMP, urine pregnancy test, CBC, anemia profile No follow-ups on file. Bess Kinds, MD 08/22/2023, 12:29 PM PGY-3, [facility]

## 2023-08-22 NOTE — Patient Instructions (Signed)
It was great to see you! Thank you for allowing me to participate in your care!  Our plans for today:  - Leg discomfort/swelling I'm not sure what is causing this issue, but don't think anything dangerous/urgent is happening. We are checking some lab work, if everything is normal, continue to monitor and see if it improves.   Pregnancy test, anemia test, electrolyte testing  *If swelling or pain get's worse, make follow up.  We are checking some labs today, I will call you if they are abnormal will send you a MyChart message or a letter if they are normal.  If you do not hear about your labs in the next 2 weeks please let us know.  Take care and seek immediate care sooner if you develop any concerns.   Dr. Bess Kinds, MD [facility]

## 2023-08-22 NOTE — Assessment & Plan Note (Addendum)
Patient appreciates leg swelling bilaterally, going on for little over a week.  Patient denies any injury or trauma.  Patient also appreciates tenderness in anterior shin, feels like dull ache.  Patient in normal state of health, with nearly 1+ pitting edema located on anterior shins.  Unsure what could be causing this, will test for pregnancy, versus electrolyte abnormality, renal dysfunction, anemia.  Symptoms could also be secondary to some fluid retention as well.  Low concern for cardiac etiology, or DVT, at this time.  Patient denies any change in activity, and walks regularly. - BMP, urine pregnancy test, CBC, anemia profile

## 2023-08-23 LAB — ANEMIA PROFILE B
Basophils Absolute: 0 10*3/uL (ref 0.0–0.2)
Basos: 1 %
EOS (ABSOLUTE): 0.1 10*3/uL (ref 0.0–0.4)
Eos: 2 %
Ferritin: 99 ng/mL (ref 15–150)
Folate: 15.3 ng/mL (ref >3.0)
Hematocrit: 37 % (ref 34.0–46.6)
Hemoglobin: 11.6 g/dL (ref 11.1–15.9)
Immature Grans (Abs): 0 10*3/uL (ref 0.0–0.1)
Immature Granulocytes: 0 %
Iron Saturation: 17 % (ref 15–55)
Iron: 57 ug/dL (ref 27–159)
Lymphocytes Absolute: 3 10*3/uL (ref 0.7–3.1)
Lymphs: 44 %
MCH: 26 pg — ABNORMAL LOW (ref 26.6–33.0)
MCHC: 31.4 g/dL — ABNORMAL LOW (ref 31.5–35.7)
MCV: 83 fL (ref 79–97)
Monocytes Absolute: 0.6 10*3/uL (ref 0.1–0.9)
Monocytes: 10 %
Neutrophils Absolute: 2.9 10*3/uL (ref 1.4–7.0)
Neutrophils: 43 %
Platelets: 285 10*3/uL (ref 150–450)
RBC: 4.47 x10E6/uL (ref 3.77–5.28)
RDW: 13.7 % (ref 11.7–15.4)
Retic Ct Pct: 1.6 % (ref 0.6–2.6)
Total Iron Binding Capacity: 338 ug/dL (ref 250–450)
UIBC: 281 ug/dL (ref 131–425)
Vitamin B-12: 741 pg/mL (ref 232–1245)
WBC: 6.7 10*3/uL (ref 3.4–10.8)

## 2023-08-23 LAB — BASIC METABOLIC PANEL
BUN/Creatinine Ratio: 11 (ref 9–23)
BUN: 7 mg/dL (ref 6–20)
CO2: 22 mmol/L (ref 20–29)
Calcium: 9.5 mg/dL (ref 8.7–10.2)
Chloride: 104 mmol/L (ref 96–106)
Creatinine, Ser: 0.66 mg/dL (ref 0.57–1.00)
Glucose: 85 mg/dL (ref 70–99)
Potassium: 4.5 mmol/L (ref 3.5–5.2)
Sodium: 140 mmol/L (ref 134–144)
eGFR: 128 mL/min/{1.73_m2} (ref >59)

## 2023-08-28 ENCOUNTER — Encounter: Payer: Self-pay | Admitting: Student

## 2023-09-30 ENCOUNTER — Encounter: Payer: Self-pay | Admitting: Student

## 2023-10-01 ENCOUNTER — Encounter: Payer: Medicaid Other | Admitting: Obstetrics & Gynecology

## 2023-10-14 ENCOUNTER — Encounter: Payer: Self-pay | Admitting: Student

## 2023-10-31 ENCOUNTER — Encounter: Payer: Self-pay | Admitting: Emergency Medicine

## 2023-10-31 ENCOUNTER — Ambulatory Visit
Admission: EM | Admit: 2023-10-31 | Discharge: 2023-10-31 | Disposition: A | Discharge status: Home / Self Care | Payer: Medicaid Other | Attending: Internal Medicine | Admitting: Internal Medicine

## 2023-10-31 ENCOUNTER — Ambulatory Visit (INDEPENDENT_AMBULATORY_CARE_PROVIDER_SITE_OTHER): Payer: Medicaid Other

## 2023-10-31 DIAGNOSIS — R051 Acute cough: Secondary | ICD-10-CM

## 2023-10-31 DIAGNOSIS — R059 Cough, unspecified: Secondary | ICD-10-CM | POA: Diagnosis not present

## 2023-10-31 DIAGNOSIS — R079 Chest pain, unspecified: Secondary | ICD-10-CM | POA: Diagnosis not present

## 2023-10-31 DIAGNOSIS — R000 Tachycardia, unspecified: Secondary | ICD-10-CM

## 2023-10-31 LAB — POC COVID19/FLU A&B COMBO
Covid Antigen, POC: NEGATIVE
Influenza A Antigen, POC: NEGATIVE
Influenza B Antigen, POC: NEGATIVE

## 2023-10-31 NOTE — ED Triage Notes (Signed)
Pt reports sharp generalized chest pain that started last night. Notes intermittent SOB and dizziness since pain began. Also notes exposure to flu x2 days ago and would like to be tested.

## 2023-10-31 NOTE — ED Provider Notes (Addendum)
[facility]    CSN: [id number] Arrival date & time: 10/31/23  1507      History   Chief Complaint Chief Complaint  Patient presents with   Chest Pain    HPI Tammy Jackson is a 23 y.o. female who presents with onset of CP when she woke up yesterday am after coughing. She denies rhinitis or body aches. Has not had a fever. The CP is described as intermittent chest pressure provoked with coughing. 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 No pertinent surgical history.  OB History     Gravida  0   Para  0   Term  0   Preterm  0   AB  0   Living  0      SAB  0   IAB  0   Ectopic  0   Multiple  0   Live Births  0            Home Medications    Prior to Admission medications   Medication Sig Start Date End Date Taking? Authorizing Provider  ibuprofen (ADVIL) 400 MG tablet Take 400 mg by mouth every 6 (six) hours as needed.   Yes [provider]  Prenatal Vit-Fe Fumarate-FA (PRENATAL VITAMIN AND MINERAL) 28-0.8 MG TABS Take 1 tablet by mouth daily. 05/20/23  Yes Alicia Amel, MD    Family History History reviewed. No pertinent family history.  Social History Social History   Tobacco Use   Smoking status: Never    Passive exposure: Yes   Smokeless tobacco: Never  Vaping Use   Vaping status: Never Used  Substance Use Topics   Alcohol use: Never   Drug use: Never     Allergies   Patient has no known allergies.   Review of Systems Review of Systems  As noted in HPI Physical Exam Triage Vital Signs ED  Triage Vitals  Encounter Vitals Group     BP 10/31/23 1534 105/74     Systolic BP Percentile --      Diastolic BP Percentile --      Pulse Rate 10/31/23 1534 (!) 130     Resp 10/31/23 1534 18     Temp 10/31/23 1534 98.8 F (37.1 C)     Temp Source 10/31/23 1534 Oral     SpO2 10/31/23 1534 97 %     Weight --      Height --      Head Circumference --      Peak Flow --      Pain Score 10/31/23 1536 8     Pain Loc --      Pain Education --      Exclude from Growth Chart --    No data found.  Updated Vital Signs BP 129/76 (BP Location: Left Arm)   Pulse (!) 136   Temp 98.3 F (36.8 C) (Oral)   Resp 20   LMP 08/28/2023 (Approximate)   SpO2 97%   Visual Acuity Right Eye Distance:   Left Eye  Distance:   Bilateral Distance:    Right Eye Near:   Left Eye Near:    Bilateral Near:     Physical Exam Physical Exam Vitals signs and nursing note reviewed.  Constitutional:      General: She is not in acute distress.    Appearance: Normal appearance. She is not ill-appearing, toxic-appearing or diaphoretic.  HENT:     Head: Normocephalic.     Right Ear: Tympanic membrane, ear canal and external ear normal.     Left Ear: Tympanic membrane, ear canal and external ear normal.     Nose: Nose normal.     Mouth/Throat:     Mouth: Mucous membranes are moist.  Eyes:     General: No scleral icterus.       Right eye: No discharge.        Left eye: No discharge.     Conjunctiva/sclera: Conjunctivae normal.  Neck:     Musculoskeletal: Neck supple. No neck rigidity.  Cardiovascular:     Rate and Rhythm: Normal rate and regular rhythm.     Heart sounds: No murmur.  Pulmonary:     Effort: Pulmonary effort is normal.     Breath sounds: Normal breath sounds. Unable to reproduce the CP with palpation Musculoskeletal: Normal range of motion.  Lymphadenopathy:     Cervical: No cervical adenopathy.  Skin:    General: Skin is warm and dry.     Coloration: Skin is not jaundiced.      Findings: No rash.  Neurological:     Mental Status: She is alert and oriented to person, place, and time.     Gait: Gait normal.  Psychiatric:        Mood and Affect: Mood normal.        Behavior: Behavior normal.        Thought Content: Thought content normal.        Judgment: Judgment normal.    UC Treatments / Results  Labs (all labs ordered are listed, but only abnormal results are displayed) Labs Reviewed  POC COVID19/FLU A&B COMBO - Normal  Covid and flu test are negative  EKG Sinus tachycardia  Radiology DG Chest 2 View Result Date: 10/31/2023 CLINICAL DATA:  Pain with coughing EXAM: CHEST - 2 VIEW COMPARISON:  11/30/2015 FINDINGS: Cardiac and mediastinal contours are within normal limits. No focal pulmonary opacity. No pleural effusion or pneumothorax. No acute osseous abnormality. IMPRESSION: No acute cardiopulmonary process. Electronically Signed   By: Wiliam Ke M.D.   On: 10/31/2023 17:24    Procedures Procedures (including critical care time)  Medications Ordered in UC Medications - No data to display  Initial Impression / Assessment and Plan / UC Course  I have reviewed the triage vital signs and the nursing notes.  Pertinent labs & imaging results that were available during my care of the patient were reviewed by me and considered in my medical decision making (see chart for details).  Tachycardia Chest pain  Sent to ER for further work up   Final Clinical Impressions(s) / UC Diagnoses   Final diagnoses:  Acute cough  Tachycardia  Chest pain, unspecified type     Discharge Instructions      Please go to the ER to have more test done which we can't ran it here. We will notify you when the final read of your chest xray is back.      ED Prescriptions   None    PDMP not reviewed this  encounter.   Rodriguez-Southworth, Nettie Elm, PA-C 10/31/23 1838    Garey Ham, PA-C 10/31/23 1839

## 2023-10-31 NOTE — Discharge Instructions (Signed)
Please go to the ER to have more test done which we can't ran it here. We will notify you when the final read of your chest xray is back.

## 2023-11-01 ENCOUNTER — Other Ambulatory Visit: Payer: Self-pay

## 2023-11-01 ENCOUNTER — Emergency Department (HOSPITAL_BASED_OUTPATIENT_CLINIC_OR_DEPARTMENT_OTHER)
Admission: EM | Admit: 2023-11-01 | Discharge: 2023-11-02 | Disposition: A | Discharge status: Home / Self Care | Payer: Medicaid Other | Attending: Emergency Medicine | Admitting: Emergency Medicine

## 2023-11-01 DIAGNOSIS — J101 Influenza due to other identified influenza virus with other respiratory manifestations: Secondary | ICD-10-CM | POA: Insufficient documentation

## 2023-11-01 DIAGNOSIS — J029 Acute pharyngitis, unspecified: Secondary | ICD-10-CM | POA: Diagnosis present

## 2023-11-01 DIAGNOSIS — J111 Influenza due to unidentified influenza virus with other respiratory manifestations: Secondary | ICD-10-CM

## 2023-11-01 DIAGNOSIS — Z20822 Contact with and (suspected) exposure to covid-19: Secondary | ICD-10-CM | POA: Diagnosis not present

## 2023-11-01 LAB — RESP PANEL BY RT-PCR (RSV, FLU A&B, COVID)  RVPGX2
Influenza A by PCR: POSITIVE — AB
Influenza B by PCR: NEGATIVE
Resp Syncytial Virus by PCR: NEGATIVE
SARS Coronavirus 2 by RT PCR: NEGATIVE

## 2023-11-01 LAB — GROUP A STREP BY PCR: Group A Strep by PCR: NOT DETECTED

## 2023-11-01 MED ORDER — ACETAMINOPHEN 325 MG PO TABS
650.0000 mg | ORAL_TABLET | Freq: Once | ORAL | Status: AC
  Administered 2023-11-01: 650 mg via ORAL
  Filled 2023-11-01: qty 2

## 2023-11-01 MED ORDER — IBUPROFEN 800 MG PO TABS
800.0000 mg | ORAL_TABLET | Freq: Once | ORAL | Status: AC
  Administered 2023-11-01: 800 mg via ORAL
  Filled 2023-11-01: qty 1

## 2023-11-01 NOTE — ED Triage Notes (Addendum)
Sick several days- fevers, chills, sore throat. Suspicious for flu due to family exposure. Tested negative off swab recently (antigen). Ibuprofen 30 min PTA.

## 2023-11-01 NOTE — ED Provider Notes (Signed)
[facility] Provider Note   CSN: [id number] Arrival date & time: 11/01/23  2105     History {Add pertinent medical, surgical, social history, OB history to HPI:1} Chief Complaint  Patient presents with   Fever    Tammy Jackson is a 23 y.o. female.  Patient not speaking to me.  History comes from mother at bedside.  Patient has been sick for several days with fever and sore throat and chills.  Has had flu contacts at home.  Was seen in urgent care yesterday and referred to the ED.  Mother reports patient is had a fever for the past 2 days as high as 102.  Has had congestion and cough.  She was seen at urgent care with chest pain which she denies to me.  She was that she had some chest pain with coughing only urgent care but none currently.  Denies shortness of breath, abdominal pain, nausea, vomiting.  No headache.  No pain with urination or blood in the urine.  History of asthma.  She denies any difficulty breathing, difficulty eating or drinking or difficulty swallowing.  Denies any significant headache or body ache.  Her main complaint is feeling fatigued and having fever.  The history is provided by the patient.  Fever Associated symptoms: congestion, myalgias, rhinorrhea and sore throat   Associated symptoms: no chest pain, no cough, no dysuria, no headaches, no nausea, no rash and no vomiting        Home Medications Prior to Admission medications   Medication Sig Start Date End Date Taking? Authorizing Provider  ibuprofen (ADVIL) 400 MG tablet Take 400 mg by mouth every 6 (six) hours as needed.    [provider]  Prenatal Vit-Fe Fumarate-FA (PRENATAL VITAMIN AND MINERAL) 28-0.8 MG TABS Take 1 tablet by mouth daily. 05/20/23   Alicia Amel, MD      Allergies    Patient has no known allergies.    Review of Systems   Review of Systems  Constitutional:  Positive for activity change, appetite change and fever.   HENT:  Positive for congestion, rhinorrhea and sore throat. Negative for sneezing.   Respiratory:  Negative for cough and shortness of breath.   Cardiovascular:  Negative for chest pain.  Gastrointestinal:  Negative for abdominal pain, nausea and vomiting.  Genitourinary:  Negative for dysuria.  Musculoskeletal:  Positive for arthralgias and myalgias. Negative for joint swelling.  Skin:  Negative for rash.  Neurological:  Positive for weakness. Negative for dizziness and headaches.   all other systems are negative except as noted in the HPI and PMH.    Physical Exam Updated Vital Signs BP 136/86   Pulse (!) 120   Temp 99.2 F (37.3 C) (Oral)   Resp 18   LMP 08/28/2023 (Approximate)   SpO2 100%  Physical Exam Vitals and nursing note reviewed.  Constitutional:      General: She is not in acute distress.    Appearance: She is well-developed.     Comments: Sleepy, not wanting to interact  HENT:     Head: Normocephalic and atraumatic.     Mouth/Throat:     Pharynx: No oropharyngeal exudate.  Eyes:     Conjunctiva/sclera: Conjunctivae normal.     Pupils: Pupils are equal, round, and reactive to light.  Neck:     Comments: No meningismus. Cardiovascular:     Rate and Rhythm: Regular rhythm. Tachycardia present.     Heart sounds: Normal  heart sounds. No murmur heard. Pulmonary:     Effort: Pulmonary effort is normal. No respiratory distress.     Breath sounds: Normal breath sounds.  Chest:     Chest wall: No tenderness.  Abdominal:     Palpations: Abdomen is soft.     Tenderness: There is no abdominal tenderness. There is no guarding or rebound.  Musculoskeletal:        General: No tenderness. Normal range of motion.     Cervical back: Normal range of motion and neck supple.  Skin:    General: Skin is warm.  Neurological:     Mental Status: She is alert and oriented to person, place, and time.     Cranial Nerves: No cranial nerve deficit.     Motor: No abnormal muscle  tone.     Coordination: Coordination normal.     Comments:  5/5 strength throughout. CN 2-12 intact.Equal grip strength.   Psychiatric:        Behavior: Behavior normal.     ED Results / Procedures / Treatments   Labs (all labs ordered are listed, but only abnormal results are displayed) Labs Reviewed  RESP PANEL BY RT-PCR (RSV, FLU A&B, COVID)  RVPGX2 - Abnormal; Notable for the following components:      Result Value   Influenza A by PCR POSITIVE (*)    All other components within normal limits  GROUP A STREP BY PCR    EKG None  Radiology DG Chest 2 View Result Date: 10/31/2023 CLINICAL DATA:  Pain with coughing EXAM: CHEST - 2 VIEW COMPARISON:  11/30/2015 FINDINGS: Cardiac and mediastinal contours are within normal limits. No focal pulmonary opacity. No pleural effusion or pneumothorax. No acute osseous abnormality. IMPRESSION: No acute cardiopulmonary process. Electronically Signed   By: Wiliam Ke M.D.   On: 10/31/2023 17:24    Procedures Procedures  {Document cardiac monitor, telemetry assessment procedure when appropriate:1}  Medications Ordered in ED Medications  ibuprofen (ADVIL) tablet 800 mg (has no administration in time range)  acetaminophen (TYLENOL) tablet 650 mg (has no administration in time range)    ED Course/ Medical Decision Making/ A&P   {   Click here for ABCD2, HEART and other calculatorsREFRESH Note before signing :1}                              Medical Decision Making Amount and/or Complexity of Data Reviewed Labs: ordered. Decision-making details documented in ED Course. Radiology: ordered and independent interpretation performed. Decision-making details documented in ED Course. ECG/medicine tests: ordered and independent interpretation performed. Decision-making details documented in ED Course.  Risk OTC drugs. Prescription drug management.  Fever for 2 days with congestion, sore throat.  Tachycardic on arrival.  Sent by urgent care  with complaint of chest pain which she now denies.  Flu swab is positive.  This likely explains her fever and tachycardia.  Will give antipyretics, chest x-ray reviewed shows no infiltrate.  {Document critical care time when appropriate:1} {Document review of labs and clinical decision tools ie heart score, Chads2Vasc2 etc:1}  {Document your independent review of radiology images, and any outside records:1} {Document your discussion with family members, caretakers, and with consultants:1} {Document social determinants of health affecting pt's care:1} {Document your decision making why or why not admission, treatments were needed:1} Final Clinical Impression(s) / ED Diagnoses Final diagnoses:  None    Rx / DC Orders ED Discharge Orders  None

## 2023-11-02 MED ORDER — OSELTAMIVIR PHOSPHATE 75 MG PO CAPS: 75.0000 mg | ORAL_CAPSULE | Freq: Two times a day (BID) | ORAL | 0 refills | Status: DC

## 2023-11-02 NOTE — Discharge Instructions (Signed)
Use Tylenol or Motrin as needed for aches and for fevers.  Follow-up with your doctor.  Take the Tamiflu as prescribed.  Return to the ED with chest pain, shortness of breath, not able to eat or drink, any concerns.

## 2023-11-03 ENCOUNTER — Ambulatory Visit: Payer: Medicaid Other | Admitting: Family Medicine

## 2023-11-03 ENCOUNTER — Encounter: Payer: Self-pay | Admitting: Student

## 2023-11-25 ENCOUNTER — Encounter: Payer: Self-pay | Admitting: Obstetrics & Gynecology

## 2023-11-28 ENCOUNTER — Other Ambulatory Visit: Payer: Self-pay

## 2023-11-28 ENCOUNTER — Ambulatory Visit (INDEPENDENT_AMBULATORY_CARE_PROVIDER_SITE_OTHER): Payer: Medicaid Other | Admitting: Obstetrics & Gynecology

## 2023-11-28 ENCOUNTER — Encounter: Payer: Self-pay | Admitting: Obstetrics & Gynecology

## 2023-11-28 VITALS — BP 132/87 | HR 85 | Wt 231.2 lb

## 2023-11-28 DIAGNOSIS — N926 Irregular menstruation, unspecified: Secondary | ICD-10-CM

## 2023-11-28 NOTE — Progress Notes (Signed)
   GYNECOLOGY OFFICE VISIT NOTE  History:   Tammy Jackson is a 23 y.o. G0P0000 here today for evaluation of irregular menstrual periods.  Always had irregular periods, and over the last few months, only had 2 periods or so.  No periods from 08/2023 until 11/26/2023, currently on period.  She is trying to get pregnant with her partner, and has been trying for a year without success.  Unable to track periods with ovulation apps due to irregular periods, has not used ovulation predictor kits. She is taking prenatal vitamins.   She denies any abnormal vaginal discharge, bleeding, pelvic pain or other concerns.    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  The following portions of the patient's history were reviewed and updated as appropriate: allergies, current medications, past family history, past medical history, past social history, past surgical history and problem list.   Health Maintenance:  ASCUS pap and negative HRHPV on 01/07/2023.    Review of Systems:  Pertinent items noted in HPI and remainder of comprehensive ROS otherwise negative.  Physical Exam:  BP 132/87   Pulse 85   Wt 231 lb 3.2 oz (104.9 kg)   LMP 11/26/2023 (Approximate)   BMI 43.68 kg/m  CONSTITUTIONAL: Well-developed, well-nourished female in no acute distress.  HEENT:  Normocephalic, atraumatic. External right and left ear normal. No scleral icterus.  NECK: Normal range of motion, supple, no masses noted on observation SKIN: No rash noted. Not diaphoretic. No erythema. No pallor. MUSCULOSKELETAL: Normal range of motion. No edema noted. NEUROLOGIC: Alert and oriented to person, place, and time. Normal muscle tone coordination. No cranial nerve deficit noted. PSYCHIATRIC: Normal mood and affect. Normal behavior. Normal  judgment and thought content. CARDIOVASCULAR: Normal heart rate noted RESPIRATORY: Effort and breath sounds normal, no problems with respiration noted ABDOMEN: No masses noted. No other overt distention noted.   PELVIC: Deferred    Assessment and Plan:     1. Menstrual periods irregular (Primary) Oligomenorrhea is due to anovulation, concerned about possible PCOS or other etiology. Labs and ultrasound ordered, will follow up results and manage accordingly. Patient's BMI is 43.68, also recommended weight loss to help restore ovulatory cycles. - DHEA-sulfate - Hemoglobin A1c - Beta hCG quant (ref lab) - Testosterone,Free and Total - TSH Rfx on Abnormal to Free T4 - Prolactin - Anti mullerian hormone - FSH/LH - Estradiol - US PELVIC COMPLETE WITH TRANSVAGINAL; Future  Routine preventative health maintenance measures emphasized. Please refer to After Visit Summary for other counseling recommendations.   Return for follow up as recommended.    I spent 30 minutes dedicated to the care of this patient including pre-visit review of records, face to face time with the patient discussing her conditions and treatments, post visit ordering of medications and appropriate tests or procedures, coordinating care and documenting this visit encounter.    Jaynie Collins, MD, FACOG Obstetrician & Gynecologist, [facility] for [employer], [facility]

## 2023-11-29 ENCOUNTER — Encounter: Payer: Self-pay | Admitting: Obstetrics & Gynecology

## 2023-11-29 LAB — TSH RFX ON ABNORMAL TO FREE T4: TSH: 0.96 u[IU]/mL (ref 0.450–4.500)

## 2023-12-02 ENCOUNTER — Ambulatory Visit (HOSPITAL_COMMUNITY)
Admission: RE | Admit: 2023-12-02 | Discharge: 2023-12-02 | Disposition: A | Discharge status: Home / Self Care | Payer: Medicaid Other | Source: Ambulatory Visit | Attending: Obstetrics & Gynecology | Admitting: Obstetrics & Gynecology

## 2023-12-02 DIAGNOSIS — N854 Malposition of uterus: Secondary | ICD-10-CM | POA: Diagnosis not present

## 2023-12-02 DIAGNOSIS — N915 Oligomenorrhea, unspecified: Secondary | ICD-10-CM | POA: Diagnosis not present

## 2023-12-02 DIAGNOSIS — N926 Irregular menstruation, unspecified: Secondary | ICD-10-CM | POA: Diagnosis not present

## 2023-12-04 ENCOUNTER — Encounter: Payer: Self-pay | Admitting: Obstetrics & Gynecology

## 2023-12-04 LAB — HEMOGLOBIN A1C
Est. average glucose Bld gHb Est-mCnc: 111 mg/dL
Hgb A1c MFr Bld: 5.5 % (ref 4.8–5.6)

## 2023-12-04 LAB — DHEA-SULFATE: DHEA-SO4: 204 ug/dL (ref 110.0–431.7)

## 2023-12-04 LAB — TESTOSTERONE,FREE AND TOTAL
Testosterone, Free: 3.2 pg/mL (ref 0.0–4.2)
Testosterone: 56 ng/dL (ref 13–71)

## 2023-12-04 LAB — FSH/LH
FSH: 9.5 m[IU]/mL
LH: 17.1 m[IU]/mL

## 2023-12-04 LAB — ANTI MULLERIAN HORMONE: ANTI-MULLERIAN HORMONE (AMH): 13 ng/mL — ABNORMAL HIGH

## 2023-12-04 LAB — ESTRADIOL: Estradiol: 53.9 pg/mL

## 2023-12-04 LAB — PROLACTIN: Prolactin: 18.1 ng/mL (ref 4.8–33.4)

## 2023-12-04 LAB — BETA HCG QUANT (REF LAB): hCG Quant: <1 m[IU]/mL

## 2023-12-05 ENCOUNTER — Telehealth: Payer: Self-pay | Admitting: Family Medicine

## 2023-12-05 NOTE — Telephone Encounter (Signed)
 Received message from nurse to schedule this patient follow up appt with Dr.Anyanwu. I called patient and got no answer so I left a voice message with appt date and time scheduled for our next available in April.

## 2024-01-09 ENCOUNTER — Encounter: Payer: Self-pay | Admitting: Student

## 2024-01-16 ENCOUNTER — Encounter: Payer: Self-pay | Admitting: Student

## 2024-01-16 ENCOUNTER — Ambulatory Visit (INDEPENDENT_AMBULATORY_CARE_PROVIDER_SITE_OTHER): Admitting: Student

## 2024-01-16 VITALS — BP 135/82 | HR 86 | Ht 61.0 in | Wt 230.0 lb

## 2024-01-16 DIAGNOSIS — G4719 Other hypersomnia: Secondary | ICD-10-CM

## 2024-01-16 NOTE — Progress Notes (Signed)
    SUBJECTIVE:   CHIEF COMPLAINT / HPI:   Patient is a 23 year old female with a family history of narcolepsy, presents with excessive daytime sleepiness for the past month. She reports feeling as though she has not slept at all, regardless of the duration of sleep. She typically gets eight to nine hours of uninterrupted sleep per night and denies tossing and turning. She occasionally snores, but not consistently. Despite adequate sleep, she wakes up feeling unrested. This sleepiness significantly impacts her daily activities, including work as a Dispensing optician. She reports feeling extremely tired and sleepy at work. She denies any significant muscle aches or pains. She does not regularly consume coffee as it tends to make her feel sleepier. LMP was 03/14 and patient express no likelihood of pregnancy.  PERTINENT  PMH / PSH: Reviewed  OBJECTIVE:   BP 135/82   Pulse 86   Ht 5\' 1"  (1.549 m)   Wt 230 lb (104.3 kg)   SpO2 100%   BMI 43.46 kg/m    Physical Exam General: Alert, well appearing, morbidly obese Cardiovascular: RRR, No Murmurs, Normal S2/S2 Respiratory: CTAB, No wheezing or Rales Abdomen: No distension or tenderness Extremities: No edema on extremities     ASSESSMENT/PLAN:   Daytime hypersomnolence Excessive daytime sleepiness persists despite adequate nocturnal sleep. Differential includes narcolepsy and sleep apnea, with family history of narcolepsy and elevated BMI of 43.46 kg/m with nocturnal snoring  suggesting sleep apnea. - Placed pulmonary referral for sleep study to evaluate for narcolepsy and sleep apnea.  Jerre Simon, MD [facility]

## 2024-01-16 NOTE — Assessment & Plan Note (Addendum)
 Excessive daytime sleepiness persists despite adequate nocturnal sleep. Differential includes narcolepsy and sleep apnea, with family history of narcolepsy and elevated BMI of 43.46 kg/m with nocturnal snoring  suggesting sleep apnea. - Placed pulmonary referral for sleep study to evaluate for narcolepsy and sleep apnea.

## 2024-01-16 NOTE — Patient Instructions (Addendum)
 Pleasure to meet you today  Suspect your daytime sleepiness could be due to apnea.  Sleep apnea is usually seen in heavier people.  I will put a referral to pulmonologist to schedule your sleep study.  Will most likely benefit from weight loss and there are occasions that could assist with this.  I recommend following up with your PCP to discuss weight loss management.

## 2024-01-17 ENCOUNTER — Encounter: Payer: Self-pay | Admitting: Student

## 2024-01-19 ENCOUNTER — Encounter: Payer: Self-pay | Admitting: Internal Medicine

## 2024-02-02 ENCOUNTER — Ambulatory Visit: Admitting: Student

## 2024-02-06 ENCOUNTER — Ambulatory Visit: Payer: Medicaid Other | Admitting: Obstetrics & Gynecology

## 2024-02-06 ENCOUNTER — Other Ambulatory Visit: Payer: Self-pay

## 2024-02-06 ENCOUNTER — Encounter: Payer: Self-pay | Admitting: Obstetrics & Gynecology

## 2024-02-06 VITALS — BP 122/75 | HR 90 | Wt 231.1 lb

## 2024-02-06 DIAGNOSIS — E282 Polycystic ovarian syndrome: Secondary | ICD-10-CM | POA: Diagnosis not present

## 2024-02-06 HISTORY — DX: Polycystic ovarian syndrome: E28.2

## 2024-02-06 MED ORDER — METFORMIN HCL 1000 MG PO TABS: ORAL_TABLET | ORAL | 5 refills | Status: DC

## 2024-02-06 NOTE — Progress Notes (Signed)
 GYNECOLOGY OFFICE VISIT NOTE  History:   Tammy Jackson is a 23 y.o. G0P0000 here today for follow up after diagnosis of PCOS and to discuss management.  No concerns.  She denies any abnormal vaginal discharge, bleeding, pelvic pain or other concerns.    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 No pertinent surgical history.  The following portions of the patient's history were reviewed and updated as appropriate: allergies, current medications, past family history, past medical history, past social history, past surgical history and problem list.   Health Maintenance:  ASCUS pap and negative HRHPV on 01/07/2023.  Review of Systems:  Pertinent items noted in HPI and remainder of comprehensive ROS otherwise negative.  Physical Exam:  BP 122/75   Pulse 90   Wt 231 lb 2 oz (104.8 kg)   LMP 11/26/2023 (Within Days)   BMI 43.67 kg/m  CONSTITUTIONAL: Well-developed, well-nourished female in no acute distress.  HEENT:  Normocephalic, atraumatic. External right and left ear normal. No scleral icterus.  NECK: Normal range of motion, supple, no masses noted on observation SKIN: No rash noted. Not diaphoretic. No erythema. No pallor. MUSCULOSKELETAL: Normal range of motion. No edema noted. NEUROLOGIC: Alert and oriented to person, place, and time. Normal muscle tone coordination. No cranial nerve deficit noted on observation. PSYCHIATRIC: Normal mood and affect. Normal behavior. Normal judgment and thought content. CARDIOVASCULAR: Normal heart rate noted RESPIRATORY: Effort and breath sounds normal, no problems with respiration noted ABDOMEN: No masses or other overt distention noted on observation. No tenderness.   PELVIC: Deferred  Labs and  Imaging Results for orders placed or performed in visit on 11/28/23 (from the past 12 weeks)  DHEA-sulfate   Collection Time: 11/28/23 11:33 AM  Result Value Ref Range   DHEA-SO4 204.0 110.0 - 431.7 ug/dL  Hemoglobin Z6X   Collection Time: 11/28/23 11:33 AM  Result Value Ref Range   Hgb A1c MFr Bld 5.5 4.8 - 5.6 %   Est. average glucose Bld gHb Est-mCnc 111 mg/dL  Beta hCG quant (ref lab)   Collection Time: 11/28/23 11:33 AM  Result Value Ref Range   hCG Quant <1 mIU/mL  Testosterone ,Free and Total   Collection Time: 11/28/23 11:33 AM  Result Value Ref Range   Testosterone  56 13 - 71 ng/dL   Testosterone , Free 3.2 0.0 - 4.2 pg/mL  Prolactin   Collection Time: 11/28/23 11:33 AM  Result Value Ref Range   Prolactin 18.1 4.8 - 33.4 ng/mL  Anti mullerian hormone   Collection Time: 11/28/23 11:33 AM  Result Value Ref Range   ANTI-MULLERIAN HORMONE (AMH) 13.0 (H) ng/mL  FSH/LH   Collection Time: 11/28/23 11:33 AM  Result Value Ref Range   LH 17.1 mIU/mL   FSH 9.5 mIU/mL  Estradiol    Collection Time: 11/28/23 11:33 AM  Result Value Ref Range   Estradiol  53.9 pg/mL  TSH Rfx on Abnormal to Free T4   Collection Time: 11/28/23 11:41 AM  Result Value Ref Range   TSH 0.960 0.450 - 4.500 uIU/mL   US  PELVIC COMPLETE WITH TRANSVAGINAL Result Date: 12/03/2023 : PROCEDURE: US  PELVIS COMPLETE WITH TRANSVAGINAL HISTORY: Patient is a 23 y/o F with oligomenorrhea/irregular menstrual periods. LMP 11/27/2023. COMPARISON: U/S pelvis 07/25/2019. TECHNIQUE: Two-dimensional transabdominal grayscale and  color Doppler ultrasound of the pelvis was performed. Transvaginal was also performed. FINDINGS: The uterus is anteverted in position and measures 6.4 x 3.4 x 2.5 cm. It demonstrates a normal, homogeneous echotexture. The endometrium measures 0.4 cm and demonstrates a normal homogeneous echotexture. The right ovary measures 3.2 x 2.3 x 2.3 cm and demonstrates a normal echotexture. There is normal color  Doppler flow. The left ovary measures 2.6 x 1.9 x 1.8 cm and demonstrates a normal echotexture. There is normal color Doppler flow. There is no fluid present within the cul-de-sac. IMPRESSION: 1. Unremarkable ultrasound of the pelvis. Thank you for allowing us  to assist in the care of this patient. Electronically Signed   By: Beula Brunswick M.D.   On: 12/03/2023 22:47       Assessment and Plan:    1. PCOS (polycystic ovarian syndrome) (Primary) Diagnosis based on oligomenorrhea and markedly increased AMH value.  Discussed diagnosis with patient and its implications.  Counseled patient about management of PCOS, recommended weight loss which helps with restoring ovulatory cycles, decreases glucose intolerance with improvement of metabolic risk, improves fertility/pregnancy rates and helps with overall health.  Even modest weight loss (5 to 10 percent reduction in body weight) in women with PCOS may result in these effects.    In addition to weight loss, OCPs are also the mainstay of pharmacologic therapy for women with PCOS for managing hyperandrogenism and menstrual dysfunction and for providing contraception.   Patient has used OCPs in the past and had mood disturbances and increased appetite, she does not want this.  Discussed that PCOS can be also treated with Metformin given its association with glucose intolerance and insulin resistance.  It can also reduce ovarian androgen production and serum free testosterone  concentrations, and help restore normal menstrual cyclicity.  Over 50% of PCOS patients on 1500mg  of Metformin daily have been shown to ovulate successfully. Common side effects include GI intolerance, kidney and liver enzyme irregularities, lactic acidosis.  She will need baseline BUN,Cr, LFTs prior to starting therapy; CMET checked today.  She will also be instructed to stop therapy if she anticipates major stresses, such as surgery or IVF, in order to avoid lactic acidosis. She was prescribed  Metformin 500 mg po daily x 1 week, then 1000 mg x 1 week, then 1500-2000 mg daily.  Patient will return in 2 months for followup and repeat CMET check.   Routine preventative health maintenance measures emphasized. Please refer to After Visit Summary for other counseling recommendations.   Return in about 2 months (around 04/07/2024) for Follow up PCOS, needs CMET check.    I spent 25 minutes dedicated to the care of this patient including pre-visit review of records, face to face time with the patient discussing her conditions and treatments, post visit ordering of medications and appropriate tests or procedures, coordinating care and documenting this visit encounter.    Lenoard Rad, MD, FACOG Obstetrician & Gynecologist, [facility] for [employer], [facility]

## 2024-02-07 LAB — COMPREHENSIVE METABOLIC PANEL WITH GFR
ALT: 17 IU/L (ref 0–32)
AST: 16 IU/L (ref 0–40)
Albumin: 4.4 g/dL (ref 4.0–5.0)
Alkaline Phosphatase: 106 IU/L (ref 44–121)
BUN/Creatinine Ratio: 11 (ref 9–23)
BUN: 8 mg/dL (ref 6–20)
Bilirubin Total: 0.2 mg/dL (ref 0.0–1.2)
CO2: 22 mmol/L (ref 20–29)
Calcium: 9.6 mg/dL (ref 8.7–10.2)
Chloride: 104 mmol/L (ref 96–106)
Creatinine, Ser: 0.7 mg/dL (ref 0.57–1.00)
Globulin, Total: 2.6 g/dL (ref 1.5–4.5)
Glucose: 85 mg/dL (ref 70–99)
Potassium: 4.5 mmol/L (ref 3.5–5.2)
Sodium: 140 mmol/L (ref 134–144)
Total Protein: 7 g/dL (ref 6.0–8.5)
eGFR: 125 mL/min/{1.73_m2} (ref >59)

## 2024-02-09 ENCOUNTER — Encounter: Payer: Self-pay | Admitting: Obstetrics & Gynecology

## 2024-02-10 ENCOUNTER — Ambulatory Visit: Admitting: Licensed Clinical Social Worker

## 2024-02-11 ENCOUNTER — Ambulatory Visit: Admitting: Licensed Clinical Social Worker

## 2024-02-24 ENCOUNTER — Ambulatory Visit: Admitting: Psychology

## 2024-03-02 ENCOUNTER — Encounter: Payer: Self-pay | Admitting: Obstetrics & Gynecology

## 2024-03-16 ENCOUNTER — Ambulatory Visit: Admitting: Internal Medicine

## 2024-03-19 ENCOUNTER — Ambulatory Visit: Admitting: Pulmonary Disease

## 2024-03-19 ENCOUNTER — Encounter: Payer: Self-pay | Admitting: Pulmonary Disease

## 2024-03-19 VITALS — BP 127/81 | HR 88 | Temp 98.2°F | Ht 61.0 in | Wt 233.0 lb

## 2024-03-19 DIAGNOSIS — R0683 Snoring: Secondary | ICD-10-CM | POA: Diagnosis not present

## 2024-03-19 DIAGNOSIS — G4719 Other hypersomnia: Secondary | ICD-10-CM

## 2024-03-19 NOTE — Progress Notes (Signed)
 Tammy Jackson    [id number]    13-Aug-2001  Primary Care Physician:Sowell, Ace Holder, MD  Referring Physician: Candee Cha, MD [address] [place],  [place] [address]  Chief complaint:   Patient being seen for excessive daytime sleepiness Concern for sleep apnea  HPI:  Longstanding history of excessive daytime sleepiness Always feels super tired even after waking up from sleep  Usually goes to bed between 1030 and 11, falls asleep easily About 1 or 2 awakenings Final wake up time between 8 AM and 11 AM  Will feel sleepy again in about couple of hours  Consistently asked to try and stay awake  Denies any sleep paralysis No hallucinations No cataplexy  Mom does have narcolepsy She has a sister with insomnia  History of asthma - Well-controlled on current medications  Never smoker  No significant dryness of the mouth in the morning Does have migraine headaches around-the-clock Does have night sweats  No sleep study in the past  No significant occupational history  Outpatient Encounter Medications as of 03/19/2024  Medication Sig   ibuprofen  (ADVIL ) 400 MG tablet Take 400 mg by mouth every 6 (six) hours as needed.   oseltamivir  (TAMIFLU ) 75 MG capsule Take 1 capsule (75 mg total) by mouth every 12 (twelve) hours.   Prenatal Vit-Fe Fumarate-FA (PRENATAL VITAMIN AND MINERAL) 28-0.8 MG TABS Take 1 tablet by mouth daily.   metFORMIN  (GLUCOPHAGE ) 1000 MG tablet Take 1/2 tablet by mouth daily for one week. Then increase to one tablet daily for one week.  Then one tablet twice a day. (Patient not taking: Reported on 03/19/2024)   No facility-administered encounter medications on file as of 03/19/2024.    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  No family history on file.  Social History   Socioeconomic History   Marital status: Single    Spouse name: Not on file   Number of children: Not on file   Years of education: Not on file   Highest education level: 12th grade  Occupational History   Not on file  Tobacco Use   Smoking status: Never    Passive exposure: Yes   Smokeless tobacco: Never  Vaping Use   Vaping status: Never Used  Substance and Sexual Activity   Alcohol use: Never   Drug use: Never   Sexual activity: Yes    Partners: Male    Birth control/protection: None  Other Topics Concern   Not on file  Social History Narrative   Not on file   Social Drivers of Health   Financial Resource Strain: Low Risk  (01/17/2023)   Overall Financial Resource Strain (CARDIA)    Difficulty of Paying Living Expenses: Not very hard  Food Insecurity: No Food Insecurity (02/06/2024)   Hunger Vital Sign    Worried About Running Out of Food in the Last Year: Never true    Ran Out of Food in the Last Year: Never true  Transportation Needs: No Transportation Needs (02/06/2024)   PRAPARE - Administrator, Civil Service (Medical): No    Lack of Transportation (Non-Medical): No  Physical Activity: Insufficiently Active (01/17/2023)   Exercise Vital Sign    Days of Exercise per Week: 4 days    Minutes of Exercise per Session: 30 min  Stress: Stress Concern Present (01/17/2023)   [employer] of Occupational Health - Occupational Stress Questionnaire    Feeling of Stress : Very much  Social Connections: Unknown (01/17/2023)   Social Connection and Isolation Panel [NHANES]    Frequency of Communication with Friends and Family: Once a week    Frequency of Social Gatherings with Friends and Family: Once a week    Attends Religious Services: More than 4 times per year    Active Member of [employer] or  Organizations: No    Attends Engineer, structural: Not on file    Marital Status: Patient declined  Intimate Partner Violence: Not on file    Review of Systems  Constitutional:  Positive for fatigue.  Respiratory:  Negative for shortness of breath.   Psychiatric/Behavioral:  Positive for sleep disturbance.     Vitals:   03/19/24 1110  BP: 127/81  Pulse: 88  Temp: 98.2 F (36.8 C)  SpO2: 97%     Physical Exam Constitutional:      Appearance: She is obese.  HENT:     Head: Normocephalic and atraumatic.     Nose: No congestion.     Mouth/Throat:     Mouth: Mucous membranes are moist.  Eyes:     General: No scleral icterus. Cardiovascular:     Rate and Rhythm: Normal rate and regular rhythm.     Heart sounds: No murmur heard.    No friction rub.  Pulmonary:     Effort: No respiratory distress.     Breath sounds: No stridor. No wheezing or rhonchi.  Musculoskeletal:     Cervical back: No rigidity or tenderness.  Neurological:     Mental Status: She is alert.  Psychiatric:        Mood and Affect: Mood normal.   Epworth Sleepiness Scale of 18   Data Reviewed: No previous sleep study on record  Assessment:  Excessive daytime sleepiness  Daytime fatigue  Class III obesity  Concern for sleep disordered breathing  Moderate probability of significant sleep disordered breathing With a family history of narcolepsy, this may be a possibility as well  Pathophysiology of sleep disordered breathing discussed with the patient Treatment options discussed with the patient  Plan/Recommendations: Schedule for a split-night study  Have patient keep about a 2-week sleep log/diary prior to the sleep study  Encouraged to ensure adequate number of hours of sleep at night  Weight loss efforts encouraged  Follow-up in about 8 weeks  Encouraged to call with significant concerns   Myer Artis MD [facility] 03/19/2024, 11:36  AM  CC: Candee Cha, MD

## 2024-03-19 NOTE — Patient Instructions (Addendum)
 Severe daytime sleepiness,  We will schedule you for a split-night sleep study  Make sure you are getting enough hours of sleep at night  Weight loss efforts encouraged  Follow-up in about 8 weeks  Keep a 2-week sleep log prior to the sleep study  Call us  with significant concerns  OSA--Living With Sleep Apnea Sleep apnea is a condition that affects your breathing while you're sleeping. Your tongue or the tissue in your throat may block the flow of air while you sleep. You may have shallow breathing or stop breathing for short periods of time. The breaks in breathing interrupt the deep sleep that you need to feel rested. Even if you don't wake up from the gaps in breathing, you may feel tired during the day. People with sleep apnea may snore loudly. You may have a headache in the morning and feel anxious or depressed. How can sleep apnea affect me? Sleep apnea increases your chances of being very tired during the day. This is called daytime fatigue. Sleep apnea can also increase your risk of: Heart attack. Stroke. Obesity. Type 2 diabetes. Heart failure. Irregular heartbeat. High blood pressure. If you are very tired during the day, you may be more likely to: Not do well in school or at work. Fall asleep while driving. Have trouble paying attention. Develop depression or anxiety. Have problems having sex. This is called sexual dysfunction. What actions can I take to manage sleep apnea? Sleep apnea treatment  If you were given a device to open your airway while you sleep, use it only as told by your health care provider. You may be given: An oral appliance. This is a mouthpiece that shifts your lower jaw forward. A continuous positive airway pressure (CPAP) device. This blows air through a mask. A nasal expiratory positive airway pressure (EPAP) device. This has valves that you put into each nostril. A bi-level positive airway pressure (BIPAP) device. This blows air through a  mask when you breathe in and breathe out. You may need surgery if other treatments don't work for you. Sleep habits Go to sleep and wake up at the same time every day. This helps set your internal clock for sleeping. If you stay up later than usual on weekends, try to get up in the morning within 2 hours of the time you usually wake up. Try to get at least 7-9 hours of sleep each night. Stop using a computer, tablet, and mobile phone a few hours before bedtime. Do not take long naps during the day. If you nap, limit it to 30 minutes. Have a relaxing bedtime routine. Reading or listening to music may relax you and help you sleep. Use your bedroom only for sleep. Keep your television and computer out of your bedroom. Keep your bedroom cool, dark, and quiet. Use a supportive mattress and pillows. Follow your provider's instructions for other changes to sleep habits. Nutrition Do not eat big meals in the evening. Do not have caffeine in the later part of the day. The effects of caffeine can last for more than 5 hours. Follow your provider's instructions for any changes to what you eat and drink. Lifestyle Do not drink alcohol before bedtime. Alcohol can cause you to fall asleep at first, but then it can cause you to wake up in the middle of the night and have trouble getting back to sleep. Do not smoke, vape, or use nicotine or tobacco. Medicines Take over-the-counter and prescription medicines only as told by your  provider. Do not use over-the-counter sleep medicine. You may become dependent on this medicine, and it can make sleep apnea worse. Do not take medicines, such as sedatives and narcotics, unless told to by your provider. Activity Exercise on most days, but avoid exercising in the evening. Exercising near bedtime can interfere with sleeping. If possible, spend time outside every day. Natural light helps with your internal clock. General information Lose weight if you need to. Stay  at a healthy weight. If you are having surgery, make sure to tell your provider that you have sleep apnea. You may need to bring your device with you. Keep all follow-up visits. Your provider will want to check on your condition. Where to find more information National Heart, Lung, and Blood Institute: [employer] This information is not intended to replace advice given to you by your health care provider. Make sure you discuss any questions you have with your health care provider. Document Revised: 01/22/2023 Document Reviewed: 01/22/2023 Elsevier Patient Education  2024 [employer].

## 2024-03-23 ENCOUNTER — Encounter: Payer: Self-pay | Admitting: *Deleted

## 2024-04-08 ENCOUNTER — Ambulatory Visit: Admitting: Obstetrics & Gynecology

## 2024-05-21 ENCOUNTER — Ambulatory Visit (INDEPENDENT_AMBULATORY_CARE_PROVIDER_SITE_OTHER): Admitting: Obstetrics & Gynecology

## 2024-05-21 ENCOUNTER — Other Ambulatory Visit: Payer: Self-pay

## 2024-05-21 VITALS — BP 109/75 | HR 94

## 2024-05-21 DIAGNOSIS — E282 Polycystic ovarian syndrome: Secondary | ICD-10-CM

## 2024-05-21 DIAGNOSIS — E669 Obesity, unspecified: Secondary | ICD-10-CM

## 2024-05-21 DIAGNOSIS — Z30011 Encounter for initial prescription of contraceptive pills: Secondary | ICD-10-CM

## 2024-05-21 MED ORDER — DROSPIRENONE-ETHINYL ESTRADIOL 3-0.02 MG PO TABS: 1.0000 | ORAL_TABLET | Freq: Every day | ORAL | 11 refills | Status: DC

## 2024-05-21 NOTE — Progress Notes (Signed)
 Patient ID: Tammy Jackson, female   DOB: 2001-01-27, 23 y.o.   MRN: [id number]  Chief Complaint  Patient presents with   Follow-up  For PCOS  HPI Tammy Jackson is a 23 y.o. female.  [id number] Patient's last menstrual period was 04/11/2024 (approximate). Irregular infrequent menses and would like to have cycle control, not trying to conceive. She did not tolerate Metformin  and stopped taking it. 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  No family history on file.  Social History Social History   Tobacco Use   Smoking status: Never    Passive exposure: Yes   Smokeless tobacco: Never  Vaping Use   Vaping status: Never Used  Substance Use Topics   Alcohol use: Never   Drug use: Never    No Known Allergies  Current Outpatient Medications  Medication Sig Dispense Refill   drospirenone -ethinyl estradiol  (YAZ) 3-0.02 MG tablet Take 1 tablet by mouth daily. 28 tablet 11   ibuprofen  (ADVIL ) 400 MG tablet Take 400 mg by mouth every 6 (six) hours as needed.     Prenatal Vit-Fe Fumarate-FA (PRENATAL VITAMIN AND MINERAL) 28-0.8 MG TABS Take 1 tablet by mouth daily. 100 tablet 3   No current facility-administered medications for this visit.    Review of Systems Review of Systems  Constitutional:  Positive for fatigue.  Respiratory: Negative.    Cardiovascular: Negative.   Gastrointestinal: Negative.   Genitourinary:  Positive for menstrual problem.    Blood pressure 109/75, pulse 94, last menstrual period 04/11/2024.  Physical Exam Physical Exam Vitals and nursing note reviewed.  Constitutional:       Appearance: She is obese.  Cardiovascular:     Rate and Rhythm: Normal rate.  Pulmonary:     Effort: Pulmonary effort is normal.  Musculoskeletal:     Cervical back: Normal range of motion.  Neurological:     General: No focal deficit present.     Mental Status: She is alert and oriented to person, place, and time.     Data Reviewed Pap 2024 CMP 01/2024 nl  Assessment PCOS (polycystic ovarian syndrome) - Plan: drospirenone -ethinyl estradiol  (YAZ) 3-0.02 MG tablet  Obesity, unspecified class, unspecified obesity type, unspecified whether serious comorbidity present  Oral contraception initiation   Plan Meds ordered this encounter  Medications   drospirenone -ethinyl estradiol  (YAZ) 3-0.02 MG tablet    Sig: Take 1 tablet by mouth daily.    Dispense:  28 tablet    Refill:  11       Lynwood Solomons 05/21/2024, 10:13 AM

## 2024-06-05 ENCOUNTER — Other Ambulatory Visit: Payer: Self-pay | Admitting: Pulmonary Disease

## 2024-06-05 DIAGNOSIS — Q796 Ehlers-Danlos syndrome, unspecified: Secondary | ICD-10-CM

## 2024-06-05 DIAGNOSIS — G4719 Other hypersomnia: Secondary | ICD-10-CM

## 2024-06-17 ENCOUNTER — Ambulatory Visit (HOSPITAL_BASED_OUTPATIENT_CLINIC_OR_DEPARTMENT_OTHER): Admitting: Pulmonary Disease

## 2024-06-30 DIAGNOSIS — R531 Weakness: Secondary | ICD-10-CM | POA: Diagnosis not present

## 2024-07-10 ENCOUNTER — Encounter: Payer: Self-pay | Admitting: Student

## 2024-07-12 ENCOUNTER — Encounter: Payer: Self-pay | Admitting: Obstetrics & Gynecology

## 2024-08-09 ENCOUNTER — Ambulatory Visit (HOSPITAL_BASED_OUTPATIENT_CLINIC_OR_DEPARTMENT_OTHER): Admitting: Pulmonary Disease

## 2024-08-09 ENCOUNTER — Encounter: Payer: Self-pay | Admitting: Student

## 2024-08-24 ENCOUNTER — Encounter: Payer: Self-pay | Admitting: Student

## 2024-08-31 ENCOUNTER — Ambulatory Visit: Admitting: Family Medicine

## 2024-08-31 ENCOUNTER — Other Ambulatory Visit (HOSPITAL_COMMUNITY)
Admission: RE | Admit: 2024-08-31 | Discharge: 2024-08-31 | Disposition: A | Discharge status: Home / Self Care | Source: Ambulatory Visit | Attending: Family Medicine | Admitting: Family Medicine

## 2024-08-31 VITALS — BP 102/68 | HR 94 | Ht 61.0 in | Wt 237.0 lb

## 2024-08-31 DIAGNOSIS — K219 Gastro-esophageal reflux disease without esophagitis: Secondary | ICD-10-CM

## 2024-08-31 DIAGNOSIS — Z113 Encounter for screening for infections with a predominantly sexual mode of transmission: Secondary | ICD-10-CM

## 2024-08-31 DIAGNOSIS — B079 Viral wart, unspecified: Secondary | ICD-10-CM

## 2024-08-31 MED ORDER — OMEPRAZOLE 40 MG PO CPDR: 40.0000 mg | DELAYED_RELEASE_CAPSULE | Freq: Every day | ORAL | 3 refills | Status: AC

## 2024-08-31 NOTE — Patient Instructions (Signed)
 It was so good to see you today! Thank you for allowing me to take care of you.  Today we discussed the following concerns and plans:  Wart on head - I have referred you to Dermatology to have this removed - they should call you top make an appointment. If you do not hear from them please let me know.  Nausea/acid reflux - start taking omeprazole daily; if this is not better in 1 month please make an appointment in our clinic to follow up.  We did some labs today; I will let you know the results when available.  If you have any concerns, please call the clinic or schedule an appointment.  It was a pleasure to take care of you today. Be well!  Lauraine Norse, DO [facility], PGY-2  Do you need your medications delivered to your home?   We'll send your prescription to the [facility] for delivery.          Address: [address] [place], [place], [place] [address]          Phone: [phone]  Please call the [facility] to speak with a pharmacist and set up your home medication delivery. If you have any questions, feel free to contact us  -- we're happy to help!  Other [place] [facility]acies that offer affordable prices on both prescriptions and over-the-counter items, as well as convenient services like vaccinations, are  [facility], at [facility]         Address:  [address], [place], [place] [address]         Phone: [phone]  [facility], located in the [facility]        Address: [address] [place], [place] [address]        Phone: [phone]  [facility], at [facility]       Address: [address], [place], [place] [address]       Phone: [phone]  [facility], at [facility]       Address: [address], [place], [place] [address]       Phone: [phone]

## 2024-08-31 NOTE — Progress Notes (Signed)
    SUBJECTIVE:   CHIEF COMPLAINT / HPI:   Bump on head Forehead Noticed one day when cousin pointed it out, she is not sure how long it has been there. No changes in size, color, Sometimes accidentally picks at it and it hurts. Sometimes bleeds when this happens. ** see photo from patient on 08/24/24**  Nausea Worse lately, feels a little bit like her acid reflux. Fruits, yogurt, spicy foods trigger. Pepcid  helps some. Worse at night when laying down. No vomiting.  STD check Not currently using birth control, not sexually active. Last sexually active 6 months ago. No vaginal discharge, odor, or itching. No dysuria.   PERTINENT  PMH / PSH: Reviewed.  OBJECTIVE:   BP 102/68   Pulse 94   Ht 5' 1 (1.549 m)   Wt 237 lb (107.5 kg)   LMP 04/13/2024   SpO2 97%   BMI 44.78 kg/m   General: well-appearing, no acute distress. HEENT: <1cm pedunculated, flaky mass in center of forehead without any signs of bleeding or drainage. Not painful to the touch. Not pearly in appearance. PERRLA, EOM grossly intact, MMM. Pulm: No increased work of breathing. Extremities: no peripheral edema. Moves all extremities equally. Neuro: Alert and oriented x3, speech normal in content, no facial asymmetry  GU Exam:  External exam: Normal-appearing female external genitalia.   Vaginal exam notable for normal ruggae.  Cervix without discharge or obvious lesion. Chaperoned exam, CMA Jessica.   ASSESSMENT/PLAN:   Assessment & Plan Wart of face Appearance consistent with wart rather than skin malignancy. Given location and skin tone I hesitate to remove in clinic for concern of significant scarring. - referral placed to Dermatology for removal Screen for STD (sexually transmitted disease) No active symptoms. Swabs and labs collected today. Gastroesophageal reflux disease, unspecified whether esophagitis present Pepcid  not controlling symptoms well. Trial omeprazole x 1 month; follow up if no  improvement.    Lauraine Norse, DO [facility]

## 2024-09-01 ENCOUNTER — Telehealth: Payer: Self-pay

## 2024-09-01 ENCOUNTER — Other Ambulatory Visit (HOSPITAL_COMMUNITY): Payer: Self-pay

## 2024-09-01 ENCOUNTER — Ambulatory Visit: Payer: Self-pay | Admitting: Family Medicine

## 2024-09-01 LAB — CERVICOVAGINAL ANCILLARY ONLY
Bacterial Vaginitis (gardnerella): POSITIVE — AB
Candida Glabrata: NEGATIVE
Candida Vaginitis: NEGATIVE
Chlamydia: POSITIVE — AB
Comment: NEGATIVE
Comment: NEGATIVE
Comment: NEGATIVE
Comment: NEGATIVE
Comment: NEGATIVE
Comment: NORMAL
Neisseria Gonorrhea: NEGATIVE
Trichomonas: NEGATIVE

## 2024-09-01 LAB — RPR: RPR Ser Ql: NONREACTIVE

## 2024-09-01 LAB — HIV ANTIBODY (ROUTINE TESTING W REFLEX): HIV Screen 4th Generation wRfx: NONREACTIVE

## 2024-09-01 MED ORDER — METRONIDAZOLE 500 MG PO TABS: 500.0000 mg | ORAL_TABLET | Freq: Two times a day (BID) | ORAL | 0 refills | Status: AC

## 2024-09-01 MED ORDER — DOXYCYCLINE MONOHYDRATE 100 MG PO TABS: 100.0000 mg | ORAL_TABLET | Freq: Two times a day (BID) | ORAL | 0 refills | Status: DC

## 2024-09-01 NOTE — Telephone Encounter (Signed)
 Rec'd PA for patients Doxycycline  RX.   PA rec'd for Doxycycline  Monohydrate 100MG  TABLETS. Capsules are covered, if you'd like to resend for Doxycycline  Monohydrate capsules.  RX sent for generic Adoxa (not sure why our system changes it to tablets).   Other covered alternatives:

## 2024-09-02 ENCOUNTER — Other Ambulatory Visit: Payer: Self-pay | Admitting: Family Medicine

## 2024-09-02 MED ORDER — DOXYCYCLINE HYCLATE 100 MG PO CAPS: 100.0000 mg | ORAL_CAPSULE | Freq: Two times a day (BID) | ORAL | 0 refills | Status: AC

## 2024-11-23 ENCOUNTER — Ambulatory Visit (HOSPITAL_BASED_OUTPATIENT_CLINIC_OR_DEPARTMENT_OTHER): Admitting: Pulmonary Disease
